# Patient Record
Sex: Male | Born: 1985 | Race: Black or African American | Hispanic: No | Marital: Single | Smoking: Current every day smoker
Health system: Southern US, Community
[De-identification: ages and names within clinical notes are randomized; demographics above are authoritative.]

## PROBLEM LIST (undated history)

## (undated) DIAGNOSIS — J020 Streptococcal pharyngitis: Secondary | ICD-10-CM

---

## 2005-12-27 ENCOUNTER — Emergency Department (HOSPITAL_COMMUNITY): Admission: EM | Admit: 2005-12-27 | Discharge: 2005-12-27 | Payer: Self-pay | Admitting: Emergency Medicine

## 2010-12-19 ENCOUNTER — Emergency Department (HOSPITAL_COMMUNITY)
Admission: EM | Admit: 2010-12-19 | Discharge: 2010-12-19 | Payer: Self-pay | Source: Home / Self Care | Admitting: Emergency Medicine

## 2010-12-23 ENCOUNTER — Emergency Department (HOSPITAL_COMMUNITY)
Admission: EM | Admit: 2010-12-23 | Discharge: 2010-12-23 | Payer: Self-pay | Source: Home / Self Care | Admitting: Emergency Medicine

## 2011-03-05 LAB — URINE MICROSCOPIC-ADD ON

## 2011-03-05 LAB — DIFFERENTIAL
Lymphocytes Relative: 19 % (ref 12–46)
Lymphs Abs: 1.1 10*3/uL (ref 0.7–4.0)
Monocytes Relative: 10 % (ref 3–12)
Neutro Abs: 3.9 10*3/uL (ref 1.7–7.7)
Neutrophils Relative %: 68 % (ref 43–77)

## 2011-03-05 LAB — CBC
Hemoglobin: 13.8 g/dL (ref 13.0–17.0)
MCH: 30.5 pg (ref 26.0–34.0)
Platelets: 206 10*3/uL (ref 150–400)
RBC: 4.53 MIL/uL (ref 4.22–5.81)
WBC: 5.7 10*3/uL (ref 4.0–10.5)

## 2011-03-05 LAB — URINALYSIS, ROUTINE W REFLEX MICROSCOPIC
Bilirubin Urine: NEGATIVE
Glucose, UA: NEGATIVE mg/dL
Ketones, ur: NEGATIVE mg/dL
Nitrite: NEGATIVE
Protein, ur: NEGATIVE mg/dL
Urobilinogen, UA: 0.2 mg/dL (ref 0.0–1.0)
pH: 7 (ref 5.0–8.0)
pH: 7.5 (ref 5.0–8.0)

## 2011-03-05 LAB — BASIC METABOLIC PANEL
CO2: 31 mEq/L (ref 19–32)
Calcium: 9.2 mg/dL (ref 8.4–10.5)
Creatinine, Ser: 0.92 mg/dL (ref 0.4–1.5)
GFR calc Af Amer: >60 mL/min (ref >60)
GFR calc non Af Amer: >60 mL/min (ref >60)
Sodium: 137 mEq/L (ref 135–145)

## 2011-06-15 ENCOUNTER — Emergency Department (HOSPITAL_COMMUNITY): Payer: Self-pay

## 2011-06-15 ENCOUNTER — Emergency Department (HOSPITAL_COMMUNITY)
Admission: EM | Admit: 2011-06-15 | Discharge: 2011-06-15 | Disposition: A | Discharge status: Home / Self Care | Payer: Self-pay | Attending: Emergency Medicine | Admitting: Emergency Medicine

## 2011-06-15 DIAGNOSIS — R079 Chest pain, unspecified: Secondary | ICD-10-CM | POA: Insufficient documentation

## 2011-06-15 DIAGNOSIS — K2970 Gastritis, unspecified, without bleeding: Secondary | ICD-10-CM | POA: Insufficient documentation

## 2011-06-15 DIAGNOSIS — K2990 Gastroduodenitis, unspecified, without bleeding: Secondary | ICD-10-CM | POA: Insufficient documentation

## 2011-06-15 LAB — RAPID STREP SCREEN (MED CTR MEBANE ONLY): Streptococcus, Group A Screen (Direct): NEGATIVE

## 2012-03-03 ENCOUNTER — Emergency Department (HOSPITAL_COMMUNITY)
Admission: EM | Admit: 2012-03-03 | Discharge: 2012-03-03 | Disposition: A | Discharge status: Home / Self Care | Payer: Self-pay | Attending: Emergency Medicine | Admitting: Emergency Medicine

## 2012-03-03 ENCOUNTER — Encounter (HOSPITAL_COMMUNITY): Payer: Self-pay | Admitting: *Deleted

## 2012-03-03 DIAGNOSIS — R369 Urethral discharge, unspecified: Secondary | ICD-10-CM | POA: Insufficient documentation

## 2012-03-03 DIAGNOSIS — R300 Dysuria: Secondary | ICD-10-CM | POA: Insufficient documentation

## 2012-03-03 DIAGNOSIS — A64 Unspecified sexually transmitted disease: Secondary | ICD-10-CM | POA: Insufficient documentation

## 2012-03-03 LAB — URINE MICROSCOPIC-ADD ON

## 2012-03-03 LAB — URINALYSIS, ROUTINE W REFLEX MICROSCOPIC
Glucose, UA: NEGATIVE mg/dL
Nitrite: NEGATIVE
pH: 6 (ref 5.0–8.0)

## 2012-03-03 MED ORDER — AZITHROMYCIN 250 MG PO TABS
1000.0000 mg | ORAL_TABLET | Freq: Once | ORAL | Status: AC
  Administered 2012-03-03: 1000 mg via ORAL

## 2012-03-03 MED ORDER — CEFTRIAXONE SODIUM 250 MG IJ SOLR
250.0000 mg | Freq: Once | INTRAMUSCULAR | Status: AC
  Administered 2012-03-03: 250 mg via INTRAMUSCULAR

## 2012-03-03 MED ORDER — LIDOCAINE-EPINEPHRINE (PF) 1 %-1:200000 IJ SOLN
INTRAMUSCULAR | Status: AC
  Administered 2012-03-03: 21:00:00
  Filled 2012-03-03: qty 10

## 2012-03-03 MED ORDER — CEFTRIAXONE SODIUM 250 MG IJ SOLR
INTRAMUSCULAR | Status: AC
  Filled 2012-03-03: qty 250

## 2012-03-03 MED ORDER — AZITHROMYCIN 250 MG PO TABS
ORAL_TABLET | ORAL | Status: AC
  Administered 2012-03-03: 1000 mg via ORAL
  Filled 2012-03-03: qty 4

## 2012-03-03 NOTE — ED Notes (Signed)
Penile d/c and dysuria .

## 2012-03-03 NOTE — Discharge Instructions (Signed)
Safer Sex Your caregiver wants you to have this information about the infections that can be transmitted from sexual contact and how to prevent them. The idea behind safer sex is that you can be sexually active, and at the same time reduce the risk of giving or getting a sexually transmitted disease (STD). Every person should be aware of how to prevent him or herself and his or her sex partner from getting an STD. CAUSES OF STDS STDs are transmitted by sharing body fluids, which contain viruses and bacteria. The following fluids all transmit infections during sexual intercourse and sex acts:  Semen.   Saliva.   Urine.   Blood.   Vaginal mucus.  Examples of STDs include:  Chlamydia.   Gonorrhea.   Genital herpes.   Hepatitis B.   Human immunodeficiency virus or acquired immunodeficiency syndrome (HIV or AIDS).   Syphilis.   Trichomonas.   Pubic lice.   Human papillomavirus (HPV), which may include:   Genital warts.   Cervical dysplasia.   Cervical cancer (can develop with certain types of HPV).  SYMPTOMS  Sexual diseases often cause few or no symptoms until they are advanced, so a person can be infected and spread the infection without knowing it. Some STDs respond to treatment very well. Others, like HIV and herpes, cannot be cured, but are treated to reduce their effects. Specific symptoms include:  Abnormal vaginal discharge.   Irritation or itching in and around the vagina, and in the pubic hair.   Pain during sexual intercourse.   Bleeding during sexual intercourse.   Pelvic or abdominal pain.   Fever.   Growths in and around the vagina.   An ulcer in or around the vagina.   Swollen glands in the groin area.  DIAGNOSIS   Blood tests.   Pap test.   Culture test of abnormal vaginal discharge.   A test that applies a solution and examines the cervix with a lighted magnifying scope (colposcopy).   A test that examines the pelvis with a lighted  tube, through a small incision (laparoscopy).  TREATMENT  The treatment will depend on the cause of the STD.  Antibiotic treatment by injection, oral, creams, or suppositories in the vagina.   Over-the-counter medicated shampoo, to get rid of pubic lice.   Removing or treating growths with medicine, freezing, burning (electrocautery), or surgery.   Surgery treatment for HPV of the cervix.   Supportive medicines for herpes, HIV, AIDS, and hepatitis.  Being careful cannot eliminate all risk of infection, but sex can be made much safer. Safe sexual practices include body massage and gentle touching. Masturbation is safe, as long as body fluids do not contact skin that has sores or cuts. Dry kissing and oral sex on a man wearing a latex condom or on a woman wearing a male condom is also safe. Slightly less safe is intercourse while the man wears a latex condom or wet kissing. It is also safer to have one sex partner that you know is not having sex with anyone else. LENGTH OF ILLNESS An STD might be treated and cured in a week, sometimes a month, or more. And it can linger with symptoms for many years. STDs can also cause damage to the male organs. This can cause chronic pain, infertility, and recurrence of the STD, especially herpes, hepatitis, HIV, and HPV. HOME CARE INSTRUCTIONS AND PREVENTION  Alcohol and recreational drugs are often the reason given for not practicing safer sex. These substances affect  your judgment. Alcohol and recreational drugs can also impair your immune system, making you more vulnerable to disease.   Do not engage in risky and dangerous sexual practices, including:   Vaginal or anal sex without a condom.   Oral sex on a man without a condom.   Oral sex on a woman without a male condom.   Using saliva to lubricate a condom.   Any other sexual contact in which body fluids or blood from one partner contact the other partner.   You should use only latex  condoms for men and water soluble lubricants. Petroleum based lubricants or oils used to lubricate a condom will weaken the condom and increase the chance that it will break.   Think very carefully before having sex with anyone who is high risk for STDs and HIV. This includes IV drug users, people with multiple sexual partners, or people who have had an STD, or a positive hepatitis or HIV blood test.   Remember that even if your partner has had only one previous partner, their previous partner might have had multiple partners. If so, you are at high risk of being exposed to an STD. You and your sex partner should be the only sex partners with each other, with no one else involved.   A vaccine is available for hepatitis B and HPV through your caregiver or the Public Health Department. Everyone should be vaccinated with these vaccines.   Avoid risky sex practices. Sex acts that can break the skin make you more likely to get an STD.  SEEK MEDICAL CARE IF:   If you think you have an STD, even if you do not have any symptoms. Contact your caregiver for evaluation and treatment, if needed.   You think or know your sex partner has acquired an STD.   You have any of the symptoms mentioned above.  Document Released: 01/17/2005 Document Revised: 11/29/2011 Document Reviewed: 11/09/2009 [facility] Patient Information 2012 [place], [place].Sexually Transmitted Disease Sexually transmitted disease (STD) refers to any infection that is passed from person to person during sexual activity. This may happen by way of saliva, semen, blood, vaginal mucus, or urine. Common STDs include:  Gonorrhea.   Chlamydia.   Syphilis.   HIV/AIDS.   Genital herpes.   Hepatitis B and C.   Trichomonas.   Human papillomavirus (HPV).   Pubic lice.  CAUSES  An STD may be spread by bacteria, virus, or parasite. A person can get an STD by:  Sexual intercourse with an infected person.   Sharing sex toys with an  infected person.   Sharing needles with an infected person.   Having intimate contact with the genitals, mouth, or rectal areas of an infected person.  SYMPTOMS  Some people may not have any symptoms, but they can still pass the infection to others. Different STDs have different symptoms. Symptoms include:  Painful or bloody urination.   Pain in the pelvis, abdomen, vagina, anus, throat, or eyes.   Skin rash, itching, irritation, growths, or sores (lesions). These usually occur in the genital or anal area.   Abnormal vaginal discharge.   Penile discharge in men.   Soft, flesh-colored skin growths in the genital or anal area.   Fever.   Pain or bleeding during sexual intercourse.   Swollen glands in the groin area.   Yellow skin and eyes (jaundice). This is seen with hepatitis.  DIAGNOSIS  To make a diagnosis, your caregiver may:  Take a medical history.  Perform a physical exam.   Take a specimen (culture) to be examined.   Examine a sample of discharge under a microscope.   Perform blood tests.   Perform a Pap test, if this applies.   Perform a colposcopy.   Perform a laparoscopy.  TREATMENT   Chlamydia, gonorrhea, trichomonas, and syphilis can be cured with antibiotic medicine.   Genital herpes, hepatitis, and HIV can be treated, but not cured, with prescribed medicines. The medicines will lessen the symptoms.   Genital warts from HPV can be treated with medicine or by freezing, burning (electrocautery), or surgery. Warts may come back.   HPV is a virus and cannot be cured with medicine or surgery.However, abnormal areas may be followed very closely by your caregiver and may be removed from the cervix, vagina, or vulva through office procedures or surgery.  If your diagnosis is confirmed, your recent sexual partners need treatment. This is true even if they are symptom-free or have a negative culture or evaluation. They should not have sex until their  caregiver says it is okay. HOME CARE INSTRUCTIONS  All sexual partners should be informed, tested, and treated for all STDs.   Take your antibiotics as directed. Finish them even if you start to feel better.   Only take over-the-counter or prescription medicines for pain, discomfort, or fever as directed by your caregiver.   Rest.   Eat a balanced diet and drink enough fluids to keep your urine clear or pale yellow.   Do not have sex until treatment is completed and you have followed up with your caregiver. STDs should be checked after treatment.   Keep all follow-up appointments, Pap tests, and blood tests as directed by your caregiver.   Only use latex condoms and water-soluble lubricants during sexual activity. Do not use petroleum jelly or oils.   Avoid alcohol and illegal drugs.   Get vaccinated for HPV and hepatitis. If you have not received these vaccines in the past, talk to your caregiver about whether one or both might be right for you.   Avoid risky sex practices that can break the skin.  The only way to avoid getting an STD is to avoid all sexual activity.Latex condoms and dental dams (for oral sex) will help lessen the risk of getting an STD, but will not completely eliminate the risk. SEEK MEDICAL CARE IF:   You have a fever.   You have any new or worsening symptoms.  Document Released: 03/02/2003 Document Revised: 11/29/2011 Document Reviewed: 03/09/2011 [facility] Patient Information 2012 [place], [place].   You have been treated for gonorrhea and chlamydia.  We will notify you if the syphilis test is positive.  Practice safe sex.  Notify your sexual partner so that she can be evaluated and treated.

## 2012-03-03 NOTE — ED Provider Notes (Signed)
History     CSN: [id number]  Arrival date & time 03/03/12  1724   First MD Initiated Contact with Patient 03/03/12 1953      Chief Complaint  Patient presents with  . Dysuria    (Consider location/radiation/quality/duration/timing/severity/associated sxs/prior treatment) HPI Comments: Dysuria and "yellowish" penile d/c x 2 days.  Unprotected sex 6-7 days ago.  Patient is a 26 y.o. male presenting with dysuria. The history is provided by the patient. No language interpreter was used.  Dysuria  This is a new problem. The current episode started 2 days ago. The problem occurs every urination. The problem has not changed since onset.The quality of the pain is described as burning. The pain is moderate. There has been no fever. He is sexually active. There is no history of pyelonephritis. Associated symptoms include discharge. Pertinent negatives include no frequency, no hematuria, no hesitancy and no urgency. He has tried nothing for the symptoms. His past medical history does not include kidney stones, single kidney, urological procedure, recurrent UTIs, urinary stasis or catheterization.    History reviewed. No pertinent past medical history.  History reviewed. No pertinent past surgical history.  History reviewed. No pertinent family history.  History  Substance Use Topics  . Smoking status: Current Everyday Smoker  . Smokeless tobacco: Not on file  . Alcohol Use: Yes      Review of Systems  Genitourinary: Positive for dysuria and discharge. Negative for hesitancy, urgency, frequency, hematuria, decreased urine volume, penile swelling, scrotal swelling, penile pain and testicular pain.  All other systems reviewed and are negative.    Allergies  Review of patient's allergies indicates no known allergies.  Home Medications  No current outpatient prescriptions on file.  BP 118/64  Pulse 92  Temp(Src) 98.3 F (36.8 C) (Oral)  Resp 20  Ht 5\' 10"  (1.778 m)  Wt 156 lb  (70.761 kg)  BMI 22.38 kg/m2  SpO2 99%  Physical Exam  Nursing note and vitals reviewed. Constitutional: He is oriented to person, place, and time. He appears well-developed and well-nourished.  HENT:  Head: Normocephalic and atraumatic.  Eyes: EOM are normal.  Neck: Normal range of motion.  Cardiovascular: Normal rate, regular rhythm, normal heart sounds and intact distal pulses.   Pulmonary/Chest: Effort normal and breath sounds normal. No respiratory distress.  Abdominal: Soft. He exhibits no distension. There is no tenderness.  Genitourinary: Testes normal. Circumcised. No penile tenderness. Discharge found.  Musculoskeletal: Normal range of motion.  Lymphadenopathy:       Right: No inguinal adenopathy present.       Left: No inguinal adenopathy present.  Neurological: He is alert and oriented to person, place, and time.  Skin: Skin is warm and dry.  Psychiatric: He has a normal mood and affect. Judgment normal.    ED Course  Procedures (including critical care time)  Labs Reviewed  URINALYSIS, ROUTINE W REFLEX MICROSCOPIC - Abnormal; Notable for the following:    APPearance HAZY (*)    Leukocytes, UA SMALL (*)    All other components within normal limits  URINE MICROSCOPIC-ADD ON  GC/CHLAMYDIA PROBE AMP, GENITAL  RPR   No results found.   1. STD (sexually transmitted disease)       MDM  Rocephin and zithromax        Worthy Rancher, PA 03/03/12 2048  Worthy Rancher, PA 03/04/12 1252

## 2012-03-05 LAB — GC/CHLAMYDIA PROBE AMP, GENITAL: GC Probe Amp, Genital: POSITIVE — AB

## 2012-03-05 NOTE — ED Provider Notes (Signed)
Medical screening examination/treatment/procedure(s) were performed by non-physician practitioner and as supervising physician I was immediately available for consultation/collaboration.    Jenel Gierke L Emilyrose Darrah, MD 03/05/12 2204 

## 2012-03-06 NOTE — ED Notes (Signed)
+   GC Patient treated with Zithromax and rocephin-chart appended per protocol MD.[facility] letter faxed

## 2012-03-08 NOTE — ED Notes (Signed)
Attempted to call Patient-No answer 

## 2012-03-09 NOTE — ED Notes (Signed)
Pt notified of results

## 2012-06-21 IMAGING — CT CT ABD-PELV W/O CM
3 of 4 series · 9 of 46 positions shown, 16 images · non-contrast
Comparison: None

CLINICAL DATA: Left-sided flank pain since last night.  Nausea
vomiting.

CT ABDOMEN AND PELVIS WITHOUT CONTRAST (CT UROGRAM)
TECHNIQUE: Contiguous axial images of the abdomen and pelvis
without oral or intravenous contrast were obtained.

[Series 3: lung 5.0 b60f · axial · 0.62mm/px · z∈[-113,-38]mm · 5 of 23 slices shown, 10 images]
[im 4/23  soft-tissue]
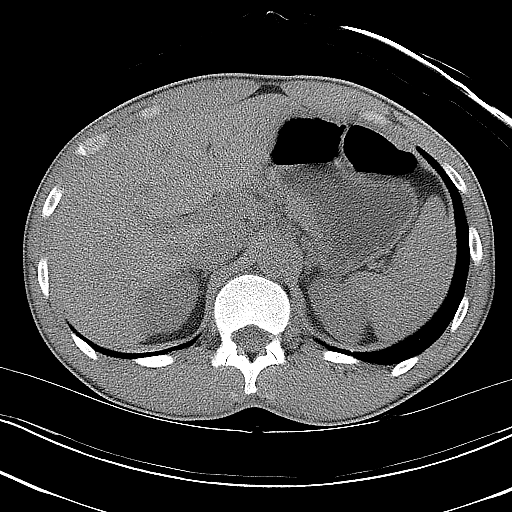
[im 4/23  bone]
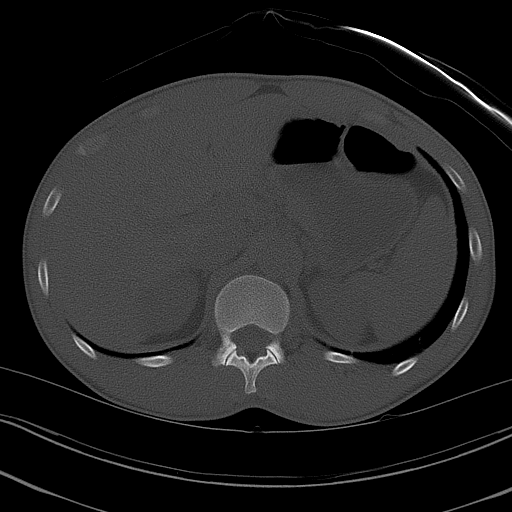
[im 8/23  soft-tissue]
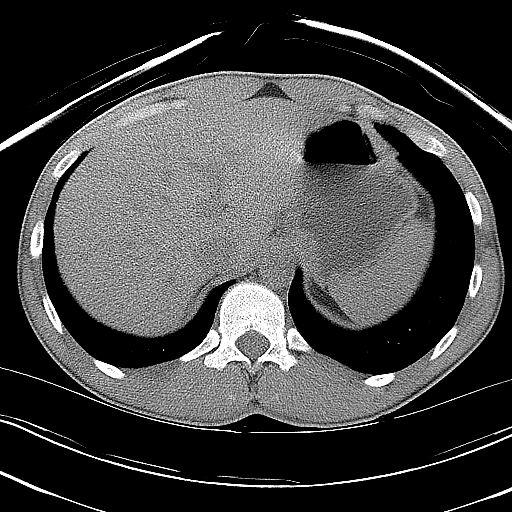
[im 8/23  lung]
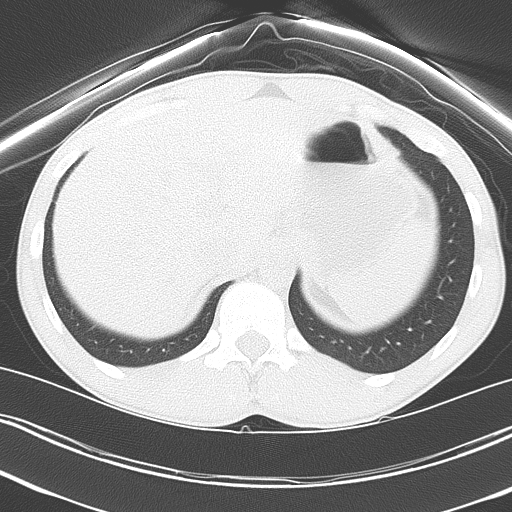
[im 12/23  soft-tissue]
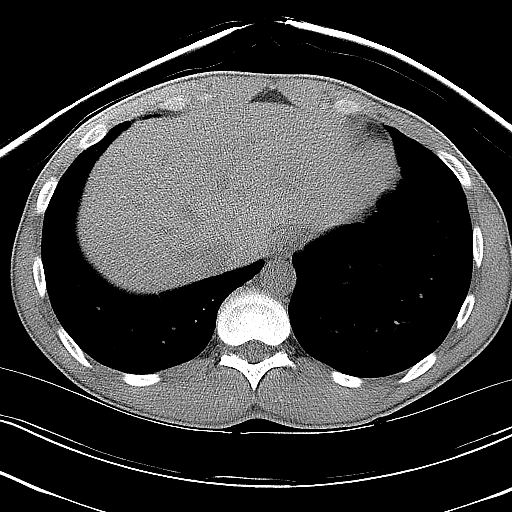
[im 12/23  lung]
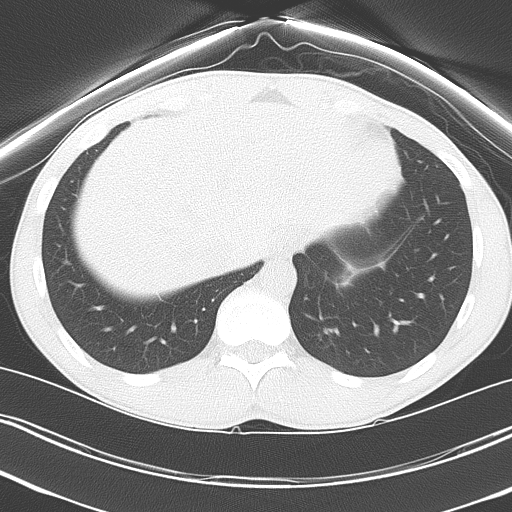
[im 15/23  soft-tissue]
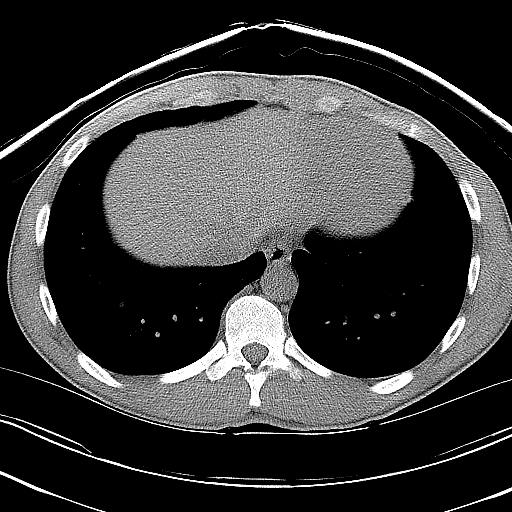
[im 15/23  lung]
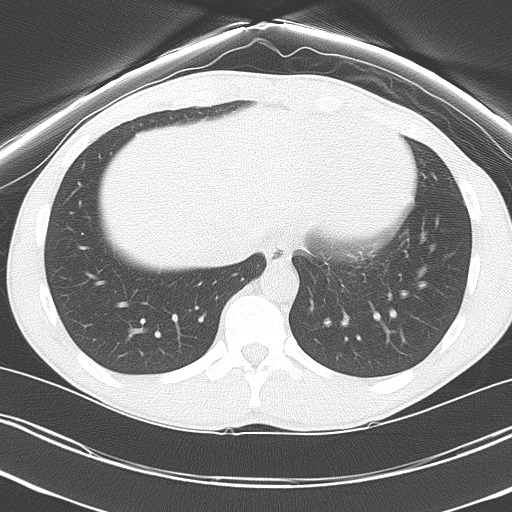
[im 19/23  soft-tissue]
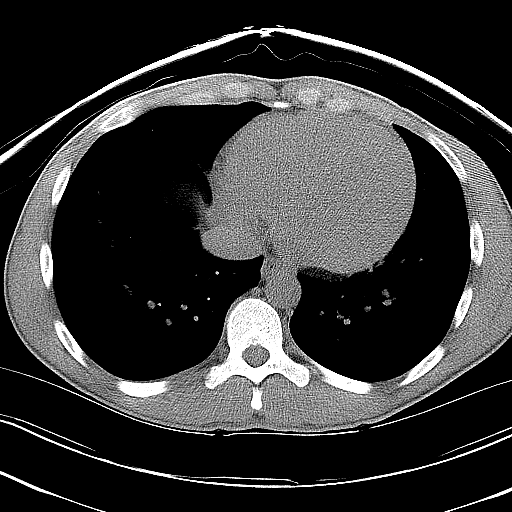
[im 19/23  lung]
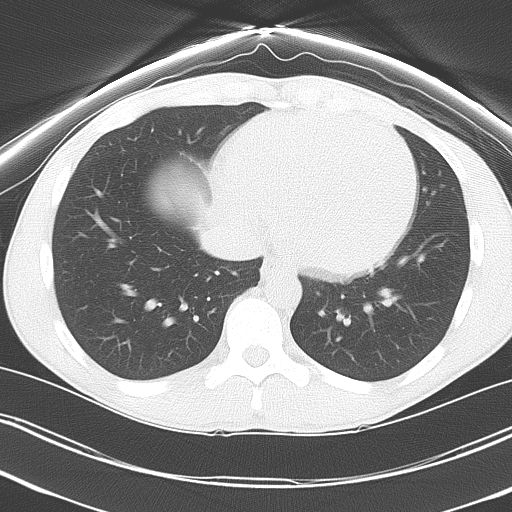

[Series 4: mpr coronal (id) · coronal · 0.64mm/px · 3 of 69 slices shown, 4 images]
[im 23/69  soft-tissue]
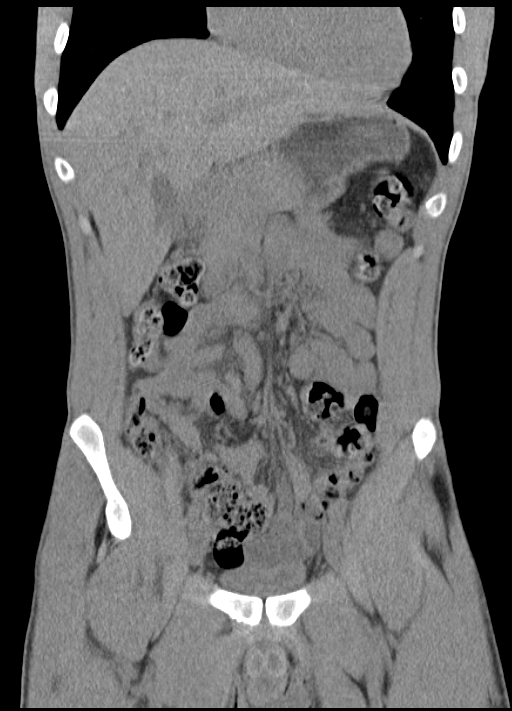
[im 31/69  soft-tissue]
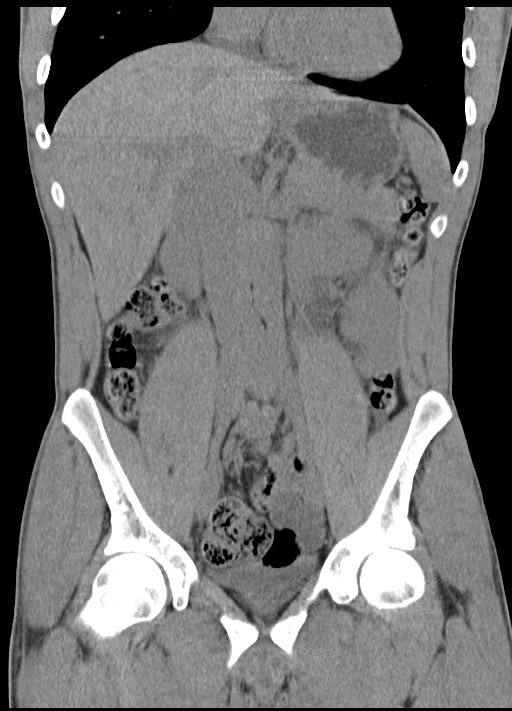
[im 31/69  bone]
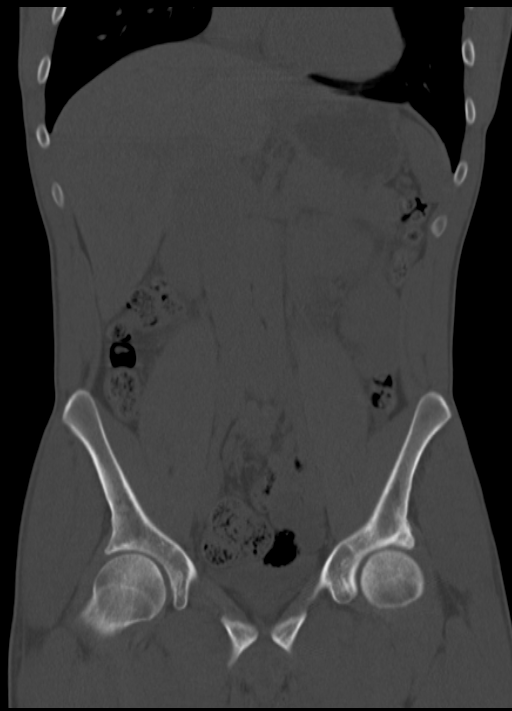
[im 38/69  soft-tissue]
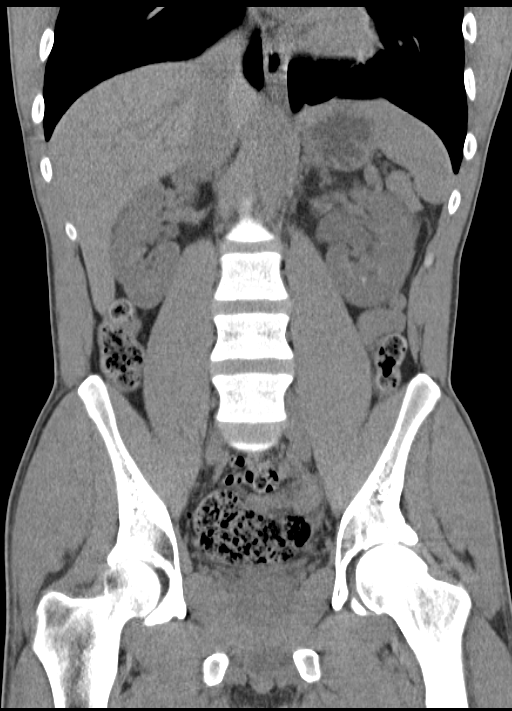

[Series 5: mpr sagittal (id) · sagittal · 0.47mm/px · 1 of 107 slices shown, 2 images]
[im 36/107  soft-tissue]
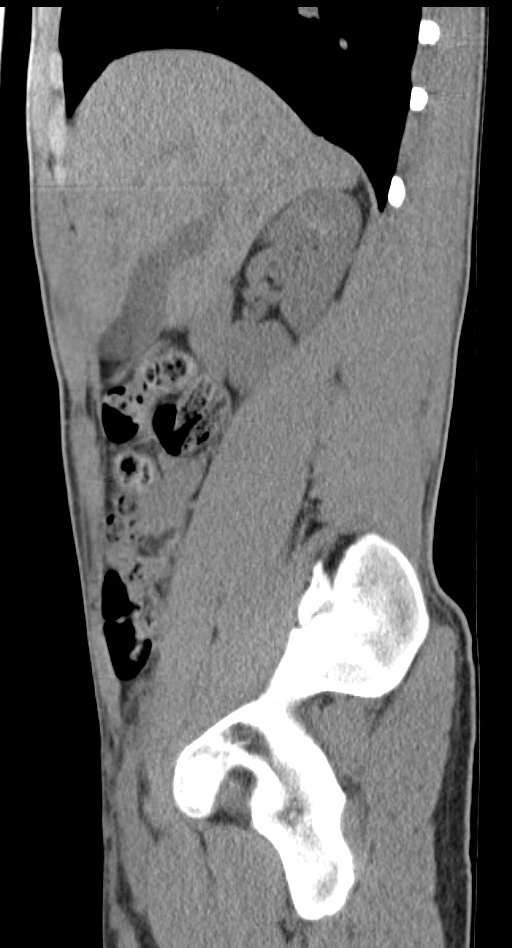
[im 36/107  bone]
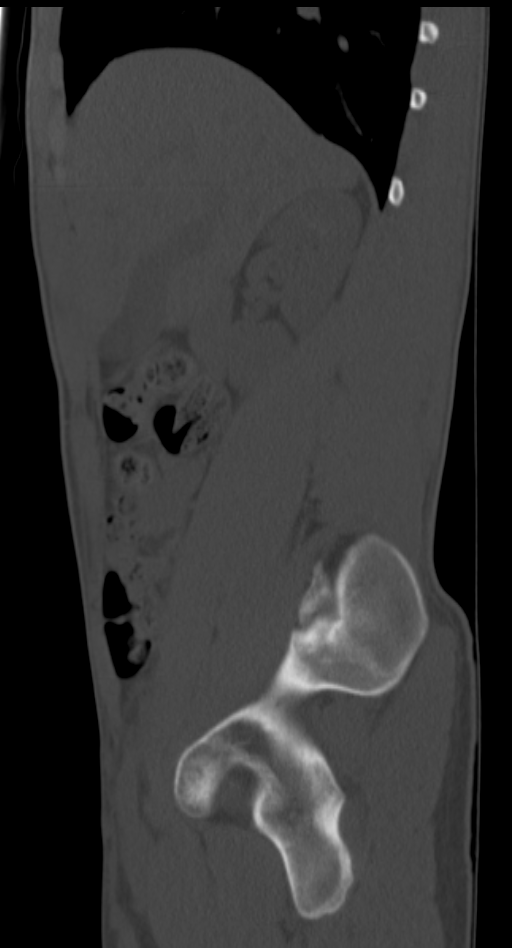

[9 of 46 positions shown; findings below may reference images not displayed]

FINDINGS: Exam is limited for evaluation of entities other than
urinary tract calculi due to lack of oral or intravenous contrast.

 Clear lung bases.  Normal heart size without pericardial or
pleural effusion.  Normal uninfused appearance of the liver,
spleen, stomach, pancreas, gallbladder, biliary tract, adrenal
glands.

Punctate interpolar right renal collecting system calculus which is
nonobstructive.  Punctate interpolar left renal calculus.

Moderate left-sided urinary tract obstruction secondary to a
proximal ureteric calculus which measures 4 mm on transverse image
39 and 5 mm on coronal image 32.

Numerous other foci of increased density within the medullary
pyramids.

No retroperitoneal or retrocrural adenopathy.

Normal colon and terminal ileum.  Normal small bowel without
abdominal ascites.

 No pelvic adenopathy.    No distal urinary tract calculi or
hydroureter.  Normal urinary bladder and prostate.  Suspect trace
cul-de-sac fluid on image 70.

A mixed sclerotic and lucent lesion within the left iliac wing
measures 2.0 cm on image 54.
IMPRESSION: 1.  Moderate urinary tract obstruction secondary to a proximal left
ureteric stone.
2.  Bilateral renal collecting system calculi.  Other foci of
increased density within the medullary pyramids are favored to
represent concentrated urine.  Three left iliac
3.  Left iliac wing lesion which is indeterminate.  Relatively
indolent appearing.  Question fibrous dysplasia.  Recommend  plain
films to confirm visualization and allow ongoing  surveillance.
4.  Trace cul-de-sac fluid is indeterminate but could be secondary
to the urinary tract obstruction.

## 2012-07-09 ENCOUNTER — Emergency Department (HOSPITAL_COMMUNITY)
Admission: EM | Admit: 2012-07-09 | Discharge: 2012-07-09 | Disposition: A | Discharge status: Home / Self Care | Payer: Self-pay | Attending: Emergency Medicine | Admitting: Emergency Medicine

## 2012-07-09 ENCOUNTER — Encounter (HOSPITAL_COMMUNITY): Payer: Self-pay | Admitting: Emergency Medicine

## 2012-07-09 DIAGNOSIS — J029 Acute pharyngitis, unspecified: Secondary | ICD-10-CM | POA: Insufficient documentation

## 2012-07-09 DIAGNOSIS — IMO0001 Reserved for inherently not codable concepts without codable children: Secondary | ICD-10-CM | POA: Insufficient documentation

## 2012-07-09 DIAGNOSIS — R509 Fever, unspecified: Secondary | ICD-10-CM | POA: Insufficient documentation

## 2012-07-09 DIAGNOSIS — F17200 Nicotine dependence, unspecified, uncomplicated: Secondary | ICD-10-CM | POA: Insufficient documentation

## 2012-07-09 DIAGNOSIS — R51 Headache: Secondary | ICD-10-CM | POA: Insufficient documentation

## 2012-07-09 HISTORY — DX: Streptococcal pharyngitis: J02.0

## 2012-07-09 LAB — RAPID STREP SCREEN (MED CTR MEBANE ONLY): Streptococcus, Group A Screen (Direct): NEGATIVE

## 2012-07-09 MED ORDER — PENICILLIN G BENZATHINE 1200000 UNIT/2ML IM SUSP
1.2000 10*6.[IU] | Freq: Once | INTRAMUSCULAR | Status: AC
  Administered 2012-07-09: 1.2 10*6.[IU] via INTRAMUSCULAR
  Filled 2012-07-09: qty 2

## 2012-07-09 MED ORDER — ONDANSETRON 4 MG PO TBDP
4.0000 mg | ORAL_TABLET | Freq: Once | ORAL | Status: AC
  Administered 2012-07-09: 4 mg via ORAL
  Filled 2012-07-09: qty 1

## 2012-07-09 MED ORDER — HYDROCODONE-ACETAMINOPHEN 7.5-500 MG/15ML PO SOLN: 15.0000 mL | ORAL | Status: AC | PRN

## 2012-07-09 MED ORDER — IBUPROFEN 100 MG/5ML PO SUSP
400.0000 mg | Freq: Once | ORAL | Status: AC
  Administered 2012-07-09: 400 mg via ORAL
  Filled 2012-07-09: qty 20

## 2012-07-09 NOTE — ED Provider Notes (Signed)
Medical screening examination/treatment/procedure(s) were performed by non-physician practitioner and as supervising physician I was immediately available for consultation/collaboration.  Donnetta Hutching, MD 07/09/12 [phone]

## 2012-07-09 NOTE — ED Provider Notes (Signed)
History     CSN: [id number]  Arrival date & time 07/09/12  1433   First MD Initiated Contact with Patient 07/09/12 1558      Chief Complaint  Patient presents with  . Sore Throat    (Consider location/radiation/quality/duration/timing/severity/associated sxs/prior treatment) Patient is a 26 y.o. male presenting with pharyngitis. The history is provided by the patient.  Sore Throat This is a new problem. The current episode started in the past 7 days. The problem occurs constantly. The problem has been gradually worsening. Associated symptoms include chills, fatigue, a fever, headaches and myalgias. Pertinent negatives include no abdominal pain, arthralgias, chest pain, coughing or neck pain. The symptoms are aggravated by swallowing. He has tried acetaminophen (salt water gargles.) for the symptoms.    Past Medical History  Diagnosis Date  . Strep throat     History reviewed. No pertinent past surgical history.  History reviewed. No pertinent family history.  History  Substance Use Topics  . Smoking status: Current Everyday Smoker  . Smokeless tobacco: Not on file  . Alcohol Use: Yes      Review of Systems  Constitutional: Positive for fever, chills and fatigue. Negative for activity change.       All ROS Neg except as noted in HPI  HENT: Negative for nosebleeds and neck pain.   Eyes: Negative for photophobia and discharge.  Respiratory: Negative for cough, shortness of breath and wheezing.   Cardiovascular: Negative for chest pain and palpitations.  Gastrointestinal: Negative for abdominal pain and blood in stool.  Genitourinary: Negative for dysuria, frequency and hematuria.  Musculoskeletal: Positive for myalgias. Negative for back pain and arthralgias.  Skin: Negative.   Neurological: Positive for headaches. Negative for dizziness, seizures and speech difficulty.  Psychiatric/Behavioral: Negative for hallucinations and confusion.    Allergies  Review of  patient's allergies indicates no known allergies.  Home Medications   Current Outpatient Rx  Name Route Sig Dispense Refill  . VAPORIZING CHEST RUB EX Apply externally Apply 1 application topically as needed. For relief    . PHENOL 1.4 % MT LIQD Mouth/Throat Use as directed 1 spray in the mouth or throat 5 (five) times daily as needed.      BP 135/66  Pulse 106  Temp 101.1 F (38.4 C) (Oral)  Resp 20  Ht 5\' 10"  (1.778 m)  Wt 155 lb (70.308 kg)  BMI 22.24 kg/m2  SpO2 100%  Physical Exam  Nursing note and vitals reviewed. Constitutional: He is oriented to person, place, and time. He appears well-developed and well-nourished.  Non-toxic appearance.  HENT:  Head: Normocephalic.  Right Ear: Tympanic membrane and external ear normal.  Left Ear: Tympanic membrane and external ear normal.       There is increased redness of the posterior pharynx. The uvula is mild to moderately swollen. The uvula remains in the midline. There is increased redness and exudate of the tonsils. There is no evidence for abscess at this time.  Eyes: EOM and lids are normal. Pupils are equal, round, and reactive to light.  Neck: Normal range of motion. Neck supple. Carotid bruit is not present.  Cardiovascular: Regular rhythm, normal heart sounds, intact distal pulses and normal pulses.  Tachycardia present.   Pulmonary/Chest: Breath sounds normal. No respiratory distress.  Abdominal: Soft. Bowel sounds are normal. There is no tenderness. There is no guarding.  Musculoskeletal: Normal range of motion.  Lymphadenopathy:       Head (right side): No submandibular adenopathy present.  Head (left side): No submandibular adenopathy present.    He has cervical adenopathy.  Neurological: He is alert and oriented to person, place, and time. He has normal strength. No cranial nerve deficit or sensory deficit.  Skin: Skin is warm and dry.  Psychiatric: He has a normal mood and affect. His speech is normal.     ED Course  Procedures (including critical care time)   Labs Reviewed  RAPID STREP SCREEN   No results found.   No diagnosis found.    MDM  I have reviewed nursing notes, vital signs, and all appropriate lab and imaging results for this patient. Patient presents with 3 days of fever, chills, body aches, and headaches. The patient has severe sore throat with difficulty swallowing anything solid. Patient was treated with Bicillin 1.2 million units. Prescription for Norco 7.5 mg liquid every 4 hours as needed for pain has been given to the patient. Patient advised to wash hands frequently, and to see his primary physician if not improving.       Kathie Dike, [place] 07/09/12 [phone]

## 2012-07-09 NOTE — ED Notes (Signed)
History of strep

## 2014-06-13 ENCOUNTER — Encounter (HOSPITAL_COMMUNITY): Payer: Self-pay | Admitting: Emergency Medicine

## 2014-06-13 ENCOUNTER — Emergency Department (HOSPITAL_COMMUNITY)
Admission: EM | Admit: 2014-06-13 | Discharge: 2014-06-13 | Disposition: A | Discharge status: Home / Self Care | Payer: Self-pay | Attending: Emergency Medicine | Admitting: Emergency Medicine

## 2014-06-13 DIAGNOSIS — Z79899 Other long term (current) drug therapy: Secondary | ICD-10-CM | POA: Insufficient documentation

## 2014-06-13 DIAGNOSIS — F17200 Nicotine dependence, unspecified, uncomplicated: Secondary | ICD-10-CM | POA: Insufficient documentation

## 2014-06-13 DIAGNOSIS — Z113 Encounter for screening for infections with a predominantly sexual mode of transmission: Secondary | ICD-10-CM | POA: Insufficient documentation

## 2014-06-13 DIAGNOSIS — J3489 Other specified disorders of nose and nasal sinuses: Secondary | ICD-10-CM | POA: Insufficient documentation

## 2014-06-13 DIAGNOSIS — Z202 Contact with and (suspected) exposure to infections with a predominantly sexual mode of transmission: Secondary | ICD-10-CM

## 2014-06-13 DIAGNOSIS — Z8619 Personal history of other infectious and parasitic diseases: Secondary | ICD-10-CM | POA: Insufficient documentation

## 2014-06-13 DIAGNOSIS — R369 Urethral discharge, unspecified: Secondary | ICD-10-CM | POA: Insufficient documentation

## 2014-06-13 LAB — URINALYSIS, ROUTINE W REFLEX MICROSCOPIC
Bilirubin Urine: NEGATIVE
Glucose, UA: NEGATIVE mg/dL
HGB URINE DIPSTICK: NEGATIVE
Ketones, ur: NEGATIVE mg/dL
Leukocytes, UA: NEGATIVE
Nitrite: NEGATIVE
PROTEIN: NEGATIVE mg/dL
Specific Gravity, Urine: 1.025 (ref 1.005–1.030)
UROBILINOGEN UA: 0.2 mg/dL (ref 0.0–1.0)
pH: 6 (ref 5.0–8.0)

## 2014-06-13 MED ORDER — CEFTRIAXONE SODIUM 250 MG IJ SOLR
250.0000 mg | Freq: Once | INTRAMUSCULAR | Status: AC
  Administered 2014-06-13: 250 mg via INTRAMUSCULAR
  Filled 2014-06-13: qty 250

## 2014-06-13 MED ORDER — LIDOCAINE HCL (PF) 1 % IJ SOLN
INTRAMUSCULAR | Status: AC
  Filled 2014-06-13: qty 5

## 2014-06-13 MED ORDER — AZITHROMYCIN 250 MG PO TABS
1000.0000 mg | ORAL_TABLET | Freq: Once | ORAL | Status: AC
  Administered 2014-06-13: 1000 mg via ORAL
  Filled 2014-06-13: qty 4

## 2014-06-13 NOTE — Discharge Instructions (Signed)
Follow up with the Health Department for additional screening such as HIV, Hepatitis and Syphilis

## 2014-06-13 NOTE — ED Notes (Signed)
Pt denies any discomfort in urination

## 2014-06-13 NOTE — ED Provider Notes (Signed)
CSN: [id number]     Arrival date & time 06/13/14  1249 History   This chart was scribed for nurse practitioner working with  Donnetta HutchingBrian Cook, MD by Andrew Auaven Small, ED Scribe. This patient was seen in room APFT24/APFT24 and the patient's care was started at 1:07 PM.  Chief Complaint  Patient presents with  . SEXUALLY TRANSMITTED DISEASE   The history is provided by the patient. No language interpreter was used.   HPI Comments:  Verdis FredericksonReginald R Tensley is a 28 y.o. Male with h/o chlamydia who presents to the Emergency Department complaining of possible STD x 2 days with associated penile discharge and swelling. Pt reports he had unprotected intercourse 4 days ago with his sons mother. He states he has not had intercourse with her in couple of months. She has had abnormal vaginal bleeding and not sure if she has an infection or if it was due to her new birth control.  He began feeling penile pain described as constant throbbing 1 day ago. Pt also noticed whitish discharge last night and again this morning. Pt has seasonal allergies which cause respiratory difficulties at times. Pt denies fever, chills, otalgia, sore throat, nausea, emesis, and abdominal pain.  Pt denies dysuria and difficulty urinating. Pt denies any other medical problems.   Past Medical History  Diagnosis Date  . Strep throat    No past surgical history on file. No family history on file. History  Substance Use Topics  . Smoking status: Current Every Day Smoker  . Smokeless tobacco: Not on file  . Alcohol Use: Yes    Review of Systems  Constitutional: Negative for fever and chills.  HENT: Positive for congestion. Negative for ear pain and sore throat.   Eyes: Negative for redness and itching.  Respiratory: Negative for cough.   Gastrointestinal: Negative for nausea, vomiting, abdominal pain and constipation.  Genitourinary: Positive for discharge and penile swelling. Negative for dysuria, urgency, frequency, hematuria, decreased  urine volume, difficulty urinating and genital sores.  Musculoskeletal: Negative for back pain.  Skin: Negative for rash.  Allergic/Immunologic: Negative for immunocompromised state.  Neurological: Negative for dizziness and headaches.  Psychiatric/Behavioral: Negative for confusion. The patient is not nervous/anxious.     Allergies  Review of patient's allergies indicates no known allergies.  Home Medications   Prior to Admission medications   Medication Sig Start Date End Date Taking? Authorizing Provider  Camphor-Eucalyptus-Menthol (VAPORIZING CHEST RUB EX) Apply 1 application topically as needed. For relief    Historical Provider, MD  phenol (CHLORASEPTIC) 1.4 % LIQD Use as directed 1 spray in the mouth or throat 5 (five) times daily as needed.    Historical Provider, MD   Triage Vitals- BP 113/71  Pulse 80  Temp(Src) 98.6 F (37 C) (Oral)  Resp 16  Ht 5\' 10"  (1.778 m)  Wt 155 lb (70.308 kg)  BMI 22.24 kg/m2  SpO2 97% Physical Exam  Nursing note and vitals reviewed. Constitutional: He is oriented to person, place, and time. He appears well-developed and well-nourished. No distress.  HENT:  Head: Normocephalic and atraumatic.  Eyes: Conjunctivae and EOM are normal.  Neck: Neck supple.  Cardiovascular: Normal rate.   Pulmonary/Chest: Effort normal. No respiratory distress.  Abdominal: Soft. There is no tenderness. Hernia confirmed negative in the right inguinal area and confirmed negative in the left inguinal area.  Genitourinary: Discharge found.  Pt is circumcised   No scrotal tenderness or edema Urethra has erythema with a small amount of discharge  Musculoskeletal:  Normal range of motion.  Lymphadenopathy:       Right: No inguinal adenopathy present.       Left: No inguinal adenopathy present.  Neurological: He is alert and oriented to person, place, and time.  Skin: Skin is warm and dry.  Psychiatric: He has a normal mood and affect. His behavior is normal.     ED Course  Procedures (including critical care time) DIAGNOSTIC STUDIES: Oxygen Saturation is 97% on RA, normal by my interpretation.   COORDINATION OF CARE: 1:17 PM-Discussed treatment plan which includes treatment for chlamydia with pt at bedside and pt agreed to plan.    Medications - No data to display  Labs Review Labs Reviewed - No data to display Results for orders placed during the hospital encounter of 06/13/14 (from the past 24 hour(s))  URINALYSIS, ROUTINE W REFLEX MICROSCOPIC     Status: None   Collection Time    06/13/14  1:06 PM      Result Value Ref Range   Color, Urine YELLOW  YELLOW   APPearance CLEAR  CLEAR   Specific Gravity, Urine 1.025  1.005 - 1.030   pH 6.0  5.0 - 8.0   Glucose, UA NEGATIVE  NEGATIVE mg/dL   Hgb urine dipstick NEGATIVE  NEGATIVE   Bilirubin Urine NEGATIVE  NEGATIVE   Ketones, ur NEGATIVE  NEGATIVE mg/dL   Protein, ur NEGATIVE  NEGATIVE mg/dL   Urobilinogen, UA 0.2  0.0 - 1.0 mg/dL   Nitrite NEGATIVE  NEGATIVE   Leukocytes, UA NEGATIVE  NEGATIVE     MDM  28 y.o. male with urethral discharge and pain. ? STD exposure. Will treat for GC and Chlamydia. Patient will follow up with the health department for additional testing. Discussed with the patient and all questioned fully answered. He will return if any problems arise.     Medication List    ASK your doctor about these medications       phenol 1.4 % Liqd  Commonly known as:  CHLORASEPTIC  Use as directed 1 spray in the mouth or throat 5 (five) times daily as needed.     VAPORIZING CHEST RUB EX  Apply 1 application topically as needed. For relief        I personally performed the services described in this documentation, which was scribed in my presence. The recorded information has been reviewed and is accurate.    [place] Point[place] M [place], [place] 06/13/14 [phone]

## 2014-06-13 NOTE — ED Notes (Signed)
Pt had sex with male partner on Thursday/Friday, Saturday pt states pain (throbbing) to penis and today noted to have white almost yellow in color discharge per pt

## 2014-06-13 NOTE — ED Notes (Signed)
Pt reports penile discharge and swelling x2 days ago. Pt denies any difficulty urinating.

## 2014-06-14 LAB — GC/CHLAMYDIA PROBE AMP
CT Probe RNA: NEGATIVE
GC PROBE AMP APTIMA: POSITIVE — AB

## 2014-06-15 ENCOUNTER — Telehealth (HOSPITAL_BASED_OUTPATIENT_CLINIC_OR_DEPARTMENT_OTHER): Payer: Self-pay | Admitting: Emergency Medicine

## 2014-06-15 NOTE — Telephone Encounter (Signed)
Positive Gonorrhea culture  Treated with:  Rocephin 250 mg IM and Azithromycin 1,000 mg PO while in ED. Sensitive to same per protocol MD  [facility]  First attempt to contact patient unsuccessful - no answer

## 2014-06-16 NOTE — ED Provider Notes (Signed)
Medical screening examination/treatment/procedure(s) were performed by non-physician practitioner and as supervising physician I was immediately available for consultation/collaboration.   EKG Interpretation None       Donnetta HutchingBrian Ieisha Gao, MD 06/16/14 [phone]

## 2014-06-21 ENCOUNTER — Telehealth (HOSPITAL_BASED_OUTPATIENT_CLINIC_OR_DEPARTMENT_OTHER): Payer: Self-pay

## 2014-06-21 ENCOUNTER — Encounter (HOSPITAL_COMMUNITY): Payer: Self-pay | Admitting: Emergency Medicine

## 2014-06-21 ENCOUNTER — Emergency Department (HOSPITAL_COMMUNITY)
Admission: EM | Admit: 2014-06-21 | Discharge: 2014-06-21 | Disposition: A | Discharge status: Home / Self Care | Payer: Self-pay | Attending: Emergency Medicine | Admitting: Emergency Medicine

## 2014-06-21 DIAGNOSIS — A64 Unspecified sexually transmitted disease: Secondary | ICD-10-CM | POA: Insufficient documentation

## 2014-06-21 DIAGNOSIS — Z8619 Personal history of other infectious and parasitic diseases: Secondary | ICD-10-CM | POA: Insufficient documentation

## 2014-06-21 DIAGNOSIS — F17200 Nicotine dependence, unspecified, uncomplicated: Secondary | ICD-10-CM | POA: Insufficient documentation

## 2014-06-21 NOTE — ED Notes (Signed)
Pt says he was pos for GC and treated here. Says his girlfriend pos for chlamydia.   Wants to be checked.

## 2014-06-21 NOTE — Discharge Instructions (Signed)
Sexually Transmitted Disease A sexually transmitted disease (STD) is a disease or infection often passed to another person during sex. However, STDs can be passed through nonsexual ways. An STD can be passed through:  Spit (saliva).  Semen.  Blood.  Mucus from the vagina.  Pee (urine). HOW CAN I LESSEN MY CHANCES OF GETTING AN STD?  Use:  Latex condoms.  Water-soluble lubricants with condoms. Do not use petroleum jelly or oils.  Dental dams. These are small pieces of latex that are used as a barrier during oral sex.  Avoid having more than one sex partner.  Do not have sex with someone who has other sex partners.  Do not have sex with anyone you do not know or who is at high risk for an STD.  Avoid risky sex that can break your skin.  Do not have sex if you have open sores on your mouth or skin.  Avoid drinking too much alcohol or taking illegal drugs. Alcohol and drugs can affect your good judgment.  Avoid oral and anal sex acts.  Get shots (vaccines) for HPV and hepatitis.  If you are at risk of being infected with HIV, it is advised that you take a certain medicine daily to prevent HIV infection. This is called pre-exposure prophylaxis (PrEP). You may be at risk if:  You are a man who has sex with other men (MSM).  You are attracted to the opposite sex (heterosexual) and are having sex with more than one partner.  You take drugs with a needle.  You have sex with someone who has HIV.  Talk with your doctor about if you are at high risk of being infected with HIV. If you begin to take PrEP, get tested for HIV first. Get tested every 3 months for as long as you are taking PrEP. WHAT SHOULD I DO IF I THINK I HAVE AN STD?  See your doctor.  Tell your sex partner(s) that you have an STD. They should be tested and treated.  Do not have sex until your doctor says it is okay. WHEN SHOULD I GET HELP? Get help right away if:  You have bad belly (abdominal)  pain.  You are a man and have puffiness (swelling) or pain in your testicles.  You are a woman and have puffiness in your vagina. Document Released: 01/17/2005 Document Revised: 12/15/2013 Document Reviewed: 06/05/2013 [employer] Patient Information 2015 [employer]. This information is not intended to replace advice given to you by your health care provider. Make sure you discuss any questions you have with your health care provider.  

## 2014-06-21 NOTE — ED Provider Notes (Signed)
CSN: [id number]     Arrival date & time 06/21/14  1835 History   First MD Initiated Contact with Patient 06/21/14 1904 This chart was scribed for non-physician practitioner Janne NapoleonHope M Chanae Gemma, NP working with Hilario Quarryanielle S Ray, MD by Valera CastleSteven Perry, ED scribe. This patient was seen in room APFT24/APFT24 and the patient's care was started at 7:10 PM.     No chief complaint on file.   (Consider location/radiation/quality/duration/timing/severity/associated sxs/prior Treatment) The history is provided by the patient. No language interpreter was used.   HPI Comments: Scott [name] is a 28 y.o. male who presents to the Emergency Department requesting an STD check. He states he was screened here recently, [facility] and [facility] treated upon his visit. He reports his girlfriend was recently tested for Chlamydia and is confused how they have different diagnoses. He denies being sexually active with anyone other than his girlfriend since then. He denies testicular pain upon palpation. He denies any other associated symptoms. He was treated on his previous visit for GC and Chlamydia. He request retesting. He is not having symptoms at this time.   PCP - No PCP Per Patient  Past Medical History  Diagnosis Date  . Strep throat    History reviewed. No pertinent past surgical history. History reviewed. No pertinent family history. History  Substance Use Topics  . Smoking status: Current Every Day Smoker -- 1.00 packs/day  . Smokeless tobacco: Not on file  . Alcohol Use: Yes     Comment: socially    Review of Systems  Constitutional: Negative for fever.  Gastrointestinal: Negative for abdominal pain.  Genitourinary: Negative for discharge and testicular pain.  All other systems reviewed and are negative.   Allergies  Review of patient's allergies indicates no known allergies.  Home Medications   Prior to Admission medications   Medication Sig Start Date End Date Taking?  Authorizing Provider  Camphor-Eucalyptus-Menthol (VAPORIZING CHEST RUB EX) Apply 1 application topically as needed. For relief    Historical Provider, MD  phenol (CHLORASEPTIC) 1.4 % LIQD Use as directed 1 spray in the mouth or throat 5 (five) times daily as needed.    Historical Provider, MD   BP 108/60  Pulse 71  Temp(Src) 97.9 F (36.6 C) (Oral)  Resp 24  Ht 5\' 10"  (1.778 m)  Wt 155 lb (70.308 kg)  BMI 22.24 kg/m2  SpO2 100% Physical Exam  Nursing note and vitals reviewed. Constitutional: He is oriented to person, place, and time. He appears well-developed and well-nourished. No distress.  HENT:  Head: Normocephalic and atraumatic.  Eyes: Conjunctivae and EOM are normal.  Neck: Neck supple. No tracheal deviation present.  Cardiovascular: Normal rate.   Pulmonary/Chest: Effort normal. No respiratory distress.  Abdominal: Hernia confirmed negative in the right inguinal area and confirmed negative in the left inguinal area.  Genitourinary: Testes normal. Circumcised.  No discharge noted.   Musculoskeletal: Normal range of motion.  Neurological: He is alert and oriented to person, place, and time.  Skin: Skin is warm and dry.  Psychiatric: He has a normal mood and affect. His behavior is normal.    ED Course  Procedures (including critical care time)  DIAGNOSTIC STUDIES: Oxygen Saturation is 100% on room air, normal by my interpretation.    COORDINATION OF CARE: 7:14 PM-Discussed treatment plan with pt at bedside and pt agreed to plan.   Results for orders placed during the hospital encounter of 06/13/14  GC/CHLAMYDIA PROBE AMP  Result Value Ref Range   CT Probe RNA NEGATIVE  NEGATIVE   GC Probe RNA POSITIVE (*) NEGATIVE  URINALYSIS, ROUTINE W REFLEX MICROSCOPIC      Result Value Ref Range   Color, Urine YELLOW  YELLOW   APPearance CLEAR  CLEAR   Specific Gravity, Urine 1.025  1.005 - 1.030   pH 6.0  5.0 - 8.0   Glucose, UA NEGATIVE  NEGATIVE mg/dL   Hgb urine  dipstick NEGATIVE  NEGATIVE   Bilirubin Urine NEGATIVE  NEGATIVE   Ketones, ur NEGATIVE  NEGATIVE mg/dL   Protein, ur NEGATIVE  NEGATIVE mg/dL   Urobilinogen, UA 0.2  0.0 - 1.0 mg/dL   Nitrite NEGATIVE  NEGATIVE   Leukocytes, UA NEGATIVE  NEGATIVE    MDM  28 y.o. male with culture positive for GC on previous visit. He was treated at that visit and request screening to see if infection has cleared. Cultures for GC and Chlamydia sent. Discussed with the patient plan of care and all questioned fully answered.   I personally performed the services described in this documentation, which was scribed in my presence. The recorded information has been reviewed and is accurate.    [facility] Orlene OchM Kadan Millstein, [place] 06/22/14 [phone]

## 2014-06-22 LAB — RPR

## 2014-06-22 NOTE — ED Provider Notes (Signed)
History/physical exam/procedure(s) were performed by non-physician practitioner and as supervising physician I was immediately available for consultation/collaboration. I have reviewed all notes and am in agreement with care and plan.   Hilario Quarryanielle S Joselynne Killam, MD 06/22/14 [phone]

## 2014-06-23 LAB — GC/CHLAMYDIA PROBE AMP
CT PROBE, AMP APTIMA: NEGATIVE
GC PROBE AMP APTIMA: NEGATIVE

## 2014-06-23 LAB — HIV ANTIBODY (ROUTINE TESTING W REFLEX): HIV: NONREACTIVE

## 2014-06-28 ENCOUNTER — Telehealth (HOSPITAL_COMMUNITY): Payer: Self-pay

## 2014-09-01 ENCOUNTER — Encounter (HOSPITAL_COMMUNITY): Payer: Self-pay | Admitting: Emergency Medicine

## 2014-09-01 ENCOUNTER — Emergency Department (HOSPITAL_COMMUNITY)
Admission: EM | Admit: 2014-09-01 | Discharge: 2014-09-01 | Disposition: A | Discharge status: Home / Self Care | Payer: Self-pay | Attending: Emergency Medicine | Admitting: Emergency Medicine

## 2014-09-01 DIAGNOSIS — B009 Herpesviral infection, unspecified: Secondary | ICD-10-CM | POA: Insufficient documentation

## 2014-09-01 DIAGNOSIS — F17200 Nicotine dependence, unspecified, uncomplicated: Secondary | ICD-10-CM | POA: Insufficient documentation

## 2014-09-01 DIAGNOSIS — R21 Rash and other nonspecific skin eruption: Secondary | ICD-10-CM | POA: Insufficient documentation

## 2014-09-01 MED ORDER — ACYCLOVIR 800 MG PO TABS
800.0000 mg | ORAL_TABLET | Freq: Once | ORAL | Status: AC
  Administered 2014-09-01: 800 mg via ORAL
  Filled 2014-09-01: qty 1

## 2014-09-01 MED ORDER — ACYCLOVIR 400 MG PO TABS: 400.0000 mg | ORAL_TABLET | Freq: Three times a day (TID) | ORAL | Status: DC

## 2014-09-01 NOTE — ED Notes (Signed)
Pt reports intermittent rash x1.5 months. Pt reports rash has come back and is now around umbilicus and reports intense itching. nad noted.

## 2014-09-01 NOTE — ED Provider Notes (Signed)
CSN: [id number]     Arrival date & time 09/01/14  1602 History   First MD Initiated Contact with Patient 09/01/14 1635     Chief Complaint  Patient presents with  . Rash     (Consider location/radiation/quality/duration/timing/severity/associated sxs/prior Treatment) Patient is a 28 y.o. male presenting with rash. The history is provided by the patient.  Rash Location:  Ano-genital Ano-genital rash location:  Pelvis and penis Quality: blistering, burning, itchiness and painful   Quality: not draining and not weeping   Pain details:    Quality:  Hot, itching and sharp   Severity:  Moderate   Onset quality:  Gradual   Duration:  2 months   Timing:  Intermittent   Progression:  Worsening Severity:  Moderate Onset quality:  Gradual Timing:  Intermittent Progression:  Worsening Chronicity:  Recurrent Context comment:  Pt unsure of context, but beleives it resulted from sexual contact. Relieved by:  Nothing Worsened by:  Nothing tried Ineffective treatments:  Anti-itch cream and antibiotic cream Associated symptoms: no abdominal pain, no fever, no joint pain, no shortness of breath and not wheezing     Past Medical History  Diagnosis Date  . Strep throat    History reviewed. No pertinent past surgical history. History reviewed. No pertinent family history. History  Substance Use Topics  . Smoking status: Current Every Day Smoker -- 0.50 packs/day  . Smokeless tobacco: Not on file  . Alcohol Use: Yes     Comment: socially    Review of Systems  Constitutional: Negative for fever and activity change.       All ROS Neg except as noted in HPI  Eyes: Negative for photophobia and discharge.  Respiratory: Negative for cough, shortness of breath and wheezing.   Cardiovascular: Negative for chest pain and palpitations.  Gastrointestinal: Negative for abdominal pain and blood in stool.  Genitourinary: Positive for genital sores. Negative for dysuria, urgency, frequency,  hematuria, decreased urine volume, discharge, penile swelling, scrotal swelling, difficulty urinating, penile pain and testicular pain.  Musculoskeletal: Negative for arthralgias, back pain and neck pain.  Skin: Positive for rash.  Neurological: Negative for dizziness, seizures and speech difficulty.  Psychiatric/Behavioral: Negative for hallucinations and confusion.      Allergies  Review of patient's allergies indicates no known allergies.  Home Medications   Prior to Admission medications   Medication Sig Start Date End Date Taking? Authorizing Provider  Camphor-Eucalyptus-Menthol (VAPORIZING CHEST RUB EX) Apply 1 application topically as needed. For relief    Historical Provider, MD  phenol (CHLORASEPTIC) 1.4 % LIQD Use as directed 1 spray in the mouth or throat 5 (five) times daily as needed.    Historical Provider, MD   BP 99/59  Pulse 70  Temp(Src) 99 F (37.2 C) (Oral)  Resp 18  Ht  (1.778 m)  Wt 155 lb (70.308 kg)  BMI 22.24 kg/m2  SpO2 98% Physical Exam  Nursing note and vitals reviewed. Constitutional: He is oriented to person, place, and time. He appears well-developed and well-nourished.  Non-toxic appearance.  HENT:  Head: Normocephalic.  Right Ear: Tympanic membrane and external ear normal.  Left Ear: Tympanic membrane and external ear normal.  Eyes: EOM and lids are normal. Pupils are equal, round, and reactive to light.  Neck: Normal range of motion. Neck supple. Carotid bruit is not present.  Cardiovascular: Normal rate, regular rhythm, normal heart sounds, intact distal pulses and normal pulses.   Pulmonary/Chest: Breath sounds normal. No respiratory distress.  Abdominal: Soft.  Bowel sounds are normal. There is no tenderness. There is no guarding.  Genitourinary:  Chest the pubic hairline there are clusters of red lesions some with small blisters, some appear to be dry, and some have been scratched No red streaking appreciated. No drainage or  discharge from this area.  There are 2 lesions with blisters on the top of the shaft of the penis. No penile discharge. No testicular involvement. No testicular swelling or tenderness. There are palpable inguinal nodes of the right and the left.   Musculoskeletal: Normal range of motion.  Lymphadenopathy:       Head (right side): No submandibular adenopathy present.       Head (left side): No submandibular adenopathy present.    He has no cervical adenopathy.  Neurological: He is alert and oriented to person, place, and time. He has normal strength. No cranial nerve deficit or sensory deficit.  Skin: Skin is warm and dry.  Psychiatric: He has a normal mood and affect. His speech is normal.    ED Course  Procedures (including critical care time) Labs Review Labs Reviewed - No data to display  Imaging Review No results found.   EKG Interpretation None      MDM  Patient advised to be evaluated at the health department for herpes titers and other testing. Prescription for acyclovir 400 mg 3 times daily given to the patient. Patient advised to refrain from sexual activity until evaluated by the health department.    Final diagnoses:  Herpes simplex    **I have reviewed nursing notes, vital signs, and all appropriate lab and imaging results for this patient.Kathie Dike, PA-C 09/03/14 1055

## 2014-09-01 NOTE — Discharge Instructions (Signed)
Please see MD at the Health Dept for additional testing. Use acyclovir three times daily with food until all taken. Please refrain from sexual activity until report from Health Dept obtained. Herpes Simplex Herpes simplex is generally classified as Type 1 or Type 2. Type 1 is generally the type that is responsible for cold sores. Type 2 is generally associated with sexually transmitted diseases. We now know that most of the thoughts on these viruses are inaccurate. We find that HSV1 is also present genitally and HSV2 can be present orally, but this will vary in different locations of the world. Herpes simplex is usually detected by doing a culture. Blood tests are also available for this virus; however, the accuracy is often not as good.  PREPARATION FOR TEST No preparation or fasting is necessary. NORMAL FINDINGS  No virus present  No HSV antigens or antibodies present Ranges for normal findings may vary among different laboratories and hospitals. You should always check with your doctor after having lab work or other tests done to discuss the meaning of your test results and whether your values are considered within normal limits. MEANING OF TEST  Your caregiver will go over the test results with you and discuss the importance and meaning of your results, as well as treatment options and the need for additional tests if necessary. OBTAINING THE TEST RESULTS  It is your responsibility to obtain your test results. Ask the lab or department performing the test when and how you will get your results. Document Released: 01/12/2005 Document Revised: 03/03/2012 Document Reviewed: 11/20/2008 [facility] Patient Information 2015 [place], [place]. This information is not intended to replace advice given to you by your health care provider. Make sure you discuss any questions you have with your health care provider.

## 2014-09-03 NOTE — ED Provider Notes (Signed)
Medical screening examination/treatment/procedure(s) were performed by non-physician practitioner and as supervising physician I was immediately available for consultation/collaboration.   EKG Interpretation None        Joya Gaskins, MD 09/03/14 2316

## 2014-12-02 ENCOUNTER — Emergency Department (HOSPITAL_COMMUNITY)
Admission: EM | Admit: 2014-12-02 | Discharge: 2014-12-02 | Disposition: A | Discharge status: Home / Self Care | Payer: Self-pay | Attending: Emergency Medicine | Admitting: Emergency Medicine

## 2014-12-02 DIAGNOSIS — Z8719 Personal history of other diseases of the digestive system: Secondary | ICD-10-CM | POA: Insufficient documentation

## 2014-12-02 DIAGNOSIS — Z79899 Other long term (current) drug therapy: Secondary | ICD-10-CM | POA: Insufficient documentation

## 2014-12-02 DIAGNOSIS — L0201 Cutaneous abscess of face: Secondary | ICD-10-CM | POA: Insufficient documentation

## 2014-12-02 DIAGNOSIS — Z720 Tobacco use: Secondary | ICD-10-CM | POA: Insufficient documentation

## 2014-12-02 MED ORDER — HYDROCODONE-ACETAMINOPHEN 5-325 MG PO TABS: 1.0000 | ORAL_TABLET | ORAL | Status: DC | PRN

## 2014-12-02 MED ORDER — LIDOCAINE HCL (PF) 2 % IJ SOLN
INTRAMUSCULAR | Status: AC
  Filled 2014-12-02: qty 10

## 2014-12-02 MED ORDER — IBUPROFEN 800 MG PO TABS
800.0000 mg | ORAL_TABLET | Freq: Once | ORAL | Status: AC
  Administered 2014-12-02: 800 mg via ORAL
  Filled 2014-12-02: qty 1

## 2014-12-02 MED ORDER — HYDROCODONE-ACETAMINOPHEN 5-325 MG PO TABS
2.0000 | ORAL_TABLET | Freq: Once | ORAL | Status: AC
  Administered 2014-12-02: 2 via ORAL
  Filled 2014-12-02: qty 2

## 2014-12-02 MED ORDER — AMOXICILLIN 250 MG PO CAPS
500.0000 mg | ORAL_CAPSULE | Freq: Once | ORAL | Status: AC
  Administered 2014-12-02: 500 mg via ORAL
  Filled 2014-12-02: qty 2

## 2014-12-02 MED ORDER — SULFAMETHOXAZOLE-TRIMETHOPRIM 800-160 MG PO TABS: 1.0000 | ORAL_TABLET | Freq: Two times a day (BID) | ORAL | Status: AC

## 2014-12-02 MED ORDER — SULFAMETHOXAZOLE-TRIMETHOPRIM 800-160 MG PO TABS
1.0000 | ORAL_TABLET | Freq: Once | ORAL | Status: AC
  Administered 2014-12-02: 1 via ORAL
  Filled 2014-12-02: qty 1

## 2014-12-02 NOTE — Care Management Note (Signed)
ED/CM noted patient did not have health insurance and/or PCP listed in the computer.  [facility] [place] [facility] with information on the clinics, food pantries, and the handout for new health insurance sign-up.Pt was also given a Rx discount card. Patient expressed appreciation for information received. He states he has signed up for insurance for next year.

## 2014-12-02 NOTE — ED Provider Notes (Signed)
CSN: [id number]     Arrival date & time 12/02/14  1202 History   First MD Initiated Contact with Patient 12/02/14 1355     Chief Complaint  Patient presents with  . Abscess     (Consider location/radiation/quality/duration/timing/severity/associated sxs/prior Treatment) Patient is a 28 y.o. male presenting with abscess. The history is provided by the patient.  Abscess Location:  Face Facial abscess location:  L cheek Abscess quality: painful and redness   Abscess quality: not draining, no warmth and not weeping   Red streaking: no   Duration:  3 days Pain details:    Quality:  Aching   Severity:  Moderate   Duration:  3 days   Timing:  Intermittent   Progression:  Worsening Chronicity:  New Context: not diabetes and not immunosuppression   Relieved by:  Nothing Worsened by:  Draining/squeezing Ineffective treatments:  Warm compresses Associated symptoms: no fever and no vomiting   Risk factors: no hx of MRSA     Past Medical History  Diagnosis Date  . Strep throat    No past surgical history on file. No family history on file. History  Substance Use Topics  . Smoking status: Current Every Day Smoker -- 0.50 packs/day  . Smokeless tobacco: Not on file  . Alcohol Use: Yes     Comment: socially    Review of Systems  Constitutional: Negative for fever and activity change.       All ROS Neg except as noted in HPI  HENT: Positive for facial swelling. Negative for nosebleeds.   Eyes: Negative for photophobia and discharge.  Respiratory: Negative for cough, shortness of breath and wheezing.   Cardiovascular: Negative for chest pain and palpitations.  Gastrointestinal: Negative for vomiting, abdominal pain and blood in stool.  Genitourinary: Negative for dysuria, frequency and hematuria.  Musculoskeletal: Negative for back pain, arthralgias and neck pain.  Skin: Positive for wound.  Neurological: Negative for dizziness, seizures and speech difficulty.    Psychiatric/Behavioral: Negative for hallucinations and confusion.      Allergies  Review of patient's allergies indicates no known allergies.  Home Medications   Prior to Admission medications   Medication Sig Start Date End Date Taking? Authorizing Provider  acyclovir (ZOVIRAX) 400 MG tablet Take 1 tablet (400 mg total) by mouth 3 (three) times daily. 09/01/14   Kathie DikeHobson M Lissy Deuser, PA-C  Camphor-Eucalyptus-Menthol (VAPORIZING CHEST RUB EX) Apply 1 application topically as needed. For relief    Historical Provider, MD  phenol (CHLORASEPTIC) 1.4 % LIQD Use as directed 1 spray in the mouth or throat 5 (five) times daily as needed.    Historical Provider, MD   BP 119/61 mmHg  Pulse 82  Temp(Src) 98.1 F (36.7 C) (Oral)  Resp 16  Ht 5\' 10"  (1.778 m)  Wt 157 lb (71.215 kg)  BMI 22.53 kg/m2  SpO2 97% Physical Exam  Constitutional: He is oriented to person, place, and time. He appears well-developed and well-nourished.  Non-toxic appearance.  HENT:  Head: Normocephalic.    Right Ear: Tympanic membrane and external ear normal.  Left Ear: Tympanic membrane and external ear normal.  Eyes: EOM and lids are normal. Pupils are equal, round, and reactive to light.  Neck: Normal range of motion. Neck supple. Carotid bruit is not present.  Cardiovascular: Normal rate, regular rhythm, normal heart sounds, intact distal pulses and normal pulses.   Pulmonary/Chest: Breath sounds normal. No respiratory distress.  Abdominal: Soft. Bowel sounds are normal. There is no tenderness. There is  no guarding.  Musculoskeletal: Normal range of motion.  Lymphadenopathy:       Head (right side): No submandibular adenopathy present.       Head (left side): No submandibular adenopathy present.    He has no cervical adenopathy.  Neurological: He is alert and oriented to person, place, and time. He has normal strength. No cranial nerve deficit or sensory deficit.  Skin: Skin is warm and dry.  Psychiatric: He  has a normal mood and affect. His speech is normal.  Nursing note and vitals reviewed.   ED Course  Procedures (including critical care time) Labs Review Labs Reviewed - No data to display  Imaging Review No results found.   EKG Interpretation None      MDM  Vital signs are well within normal limits. Pulse oximetry is 97% on room air. Within normal limits by my interpretation. The examination is consistent with a small abscess involving the right face. There is no evidence of red streaking or cellulitis. There is no current drainage. The patient will be treated with Septra and Norco as well as warm compresses. Patient is to see Dr. Lovell SheehanJenkins for additional evaluation if not improving.    Final diagnoses:  Abscess of face    **I have reviewed nursing notes, vital signs, and all appropriate lab and imaging results for this patient.Kathie Dike*    Marshay Slates M Londyn Wotton, PA-C 12/03/14 1657  Donnetta HutchingBrian Cook, MD 12/04/14 [phone]

## 2014-12-02 NOTE — ED Notes (Signed)
Pt presented with ingrown hair/abscess to right jaw. Swelling noted to right mouth area. Pt c/o of tenderness to the area. Took Motrin this am with no relief.

## 2014-12-02 NOTE — Discharge Instructions (Signed)
Please use 600 mg of ibuprofen and Septra DS daily, please take these medications with food. May use Norco for pain if needed every 4 hours. Norco may cause drowsiness. Please apply warm compress to your right face 3-4 times daily until this resolves. Please see the surgeon listed above if not improving.

## 2014-12-04 ENCOUNTER — Emergency Department (HOSPITAL_COMMUNITY)
Admission: EM | Admit: 2014-12-04 | Discharge: 2014-12-04 | Disposition: A | Discharge status: Home / Self Care | Payer: Self-pay | Attending: Emergency Medicine | Admitting: Emergency Medicine

## 2014-12-04 ENCOUNTER — Encounter (HOSPITAL_COMMUNITY): Payer: Self-pay | Admitting: Emergency Medicine

## 2014-12-04 DIAGNOSIS — Z792 Long term (current) use of antibiotics: Secondary | ICD-10-CM | POA: Insufficient documentation

## 2014-12-04 DIAGNOSIS — Z79899 Other long term (current) drug therapy: Secondary | ICD-10-CM | POA: Insufficient documentation

## 2014-12-04 DIAGNOSIS — L0201 Cutaneous abscess of face: Secondary | ICD-10-CM | POA: Insufficient documentation

## 2014-12-04 DIAGNOSIS — Z8709 Personal history of other diseases of the respiratory system: Secondary | ICD-10-CM | POA: Insufficient documentation

## 2014-12-04 DIAGNOSIS — Z720 Tobacco use: Secondary | ICD-10-CM | POA: Insufficient documentation

## 2014-12-04 NOTE — ED Provider Notes (Signed)
CSN: [id number]     Arrival date & time 12/04/14  1029 History  This chart was scribed for non-physician practitioner, Karmen Stabsori Eulalio Reamy, PA-C working with No att. providers found, by Jarvis Morganaylor Ferguson, ED Scribe. This patient was seen in room TR05C/TR05C and the patient's care was started at 2:33 PM.    Chief Complaint  Patient presents with  . Abscess    The history is provided by the patient. No language interpreter was used.    HPI Comments: Scott [name] is a 28 y.o. male who presents to the Emergency Department complaining of of an abscess to his right cheek for 2-3 days. Pt was here 2 days ago with similar symptoms. Pt believe it to be a ingrown hair and he has been trying to remove the ingrown hair with tweezers. He is having associated minimal swelling and redness to the area. He states that it has been increasing in size. Pt denies any drainage from the area. He has applied heat to the area with no relief. He was prescribed Norco 2 days ago and states he he has been taking it as well as an abx. Pt has been able to eat and drink. He denies any red streaks, fevers, chills, nausea or vomiting.   Past Medical History  Diagnosis Date  . Strep throat    History reviewed. No pertinent past surgical history. History reviewed. No pertinent family history. History  Substance Use Topics  . Smoking status: Current Every Day Smoker -- 0.50 packs/day  . Smokeless tobacco: Not on file  . Alcohol Use: Yes     Comment: socially    Review of Systems  Constitutional: Negative for fever and chills.  Gastrointestinal: Negative for nausea and vomiting.  Skin: Positive for color change (redness to the area but no red streaks).      Allergies  Review of patient's allergies indicates no known allergies.  Home Medications   Prior to Admission medications   Medication Sig Start Date End Date Taking? Authorizing Provider  acyclovir (ZOVIRAX) 400 MG tablet Take 1 tablet (400 mg total) by mouth 3  (three) times daily. 09/01/14   Kathie DikeHobson M Bryant, PA-C  Camphor-Eucalyptus-Menthol (VAPORIZING CHEST RUB EX) Apply 1 application topically as needed. For relief    Historical Provider, MD  HYDROcodone-acetaminophen (NORCO/VICODIN) 5-325 MG per tablet Take 1 tablet by mouth every 4 (four) hours as needed. 12/02/14   Kathie DikeHobson M Bryant, PA-C  phenol (CHLORASEPTIC) 1.4 % LIQD Use as directed 1 spray in the mouth or throat 5 (five) times daily as needed.    Historical Provider, MD  sulfamethoxazole-trimethoprim (BACTRIM DS,SEPTRA DS) 800-160 MG per tablet Take 1 tablet by mouth 2 (two) times daily. 12/02/14 12/09/14  Kathie DikeHobson M Bryant, PA-C   Triage Vitals: BP 116/63 mmHg  Pulse 82  Temp(Src) 97.8 F (36.6 C) (Oral)  Resp 18  SpO2 100%  Physical Exam  Constitutional: He appears well-developed and well-nourished. No distress.  HENT:  Head: Normocephalic and atraumatic.  Eyes: Conjunctivae are normal. Right eye exhibits no discharge. Left eye exhibits no discharge.  Pulmonary/Chest: Effort normal. No respiratory distress.  Neurological: He is alert. Coordination normal.  Skin: He is not diaphoretic.  2cm area of fluctuance to right lower face, not involving the lip. Mild redness but no red streaks for signs of cellulitis.   Nursing note and vitals reviewed.   ED Course  Procedures (including critical care time)  DIAGNOSTIC STUDIES: Oxygen Saturation is 100% on RA, normal by my interpretation.  COORDINATION OF CARE: 11:22 AM  INCISION AND DRAINAGE  Performed by: Louann SjogrenVictoria L Sherra Kimmons, PA-C   Consent - Verbal Consent obtained Risks and benefits: risks/benefits and alternatives were discussed  Type: Abscess  Body Area: right cheek  Anesthesia: none  Incision with: 18 gauge needle  Anesthetic total: none  Complexity: Complex  Blunt dissection to break up loculations  Drainage: Purulent  Drainage amount: 8-10 ml  Packing material: none  Area irrigated with saline.  Patient  tolerance: Patient tolerated the procedure well with no immediate complications     Labs Review Labs Reviewed - No data to display  Imaging Review No results found.   EKG Interpretation None      MDM   Final diagnoses:  Abscess of face   Patient with skin abscess amenable to incision and drainage.  Abscess incised with 18-gauge needle. Blunt dissection was performed as incision wound would enabled. Copious amounts of pus were expelled. Abscess/incision was not large enough to warrant packing or drain. I do not want to make an larger incision larger due to cosmesis. Encouraged home warm soaks and flushing.  Patient to follow-up with either ENT or general surgery for further management.  Will d/c to home.  Mild signs of cellulitis is surrounding skin. Patient to continue to take antibiotics as prescribed.  Discussed return precautions with patient. Discussed all results and patient verbalizes understanding and agrees with plan.  I personally performed the services described in this documentation, which was scribed in my presence. The recorded information has been reviewed and is accurate.   Louann SjogrenVictoria L Keyanni Whittinghill, PA-C 12/04/14 1442  Louann SjogrenVictoria L Abie Cheek, PA-C 12/04/14 1442  Gerhard Munchobert Lockwood, MD 12/04/14 [phone]

## 2014-12-04 NOTE — Discharge Instructions (Signed)
Return to the emergency room with worsening of symptoms, new symptoms or with symptoms that are concerning, especially fevers, chills, nausea, vomiting, increased swelling, redness, red streaks from the area. Call to make an appointment with either general surgery or ENT for further management. Continue to take your antibiotics as prescribed. Continue with warm soaks Please take all of your antibiotics until finished!   You may develop abdominal discomfort or diarrhea from the antibiotic.  You may help offset this with probiotics which you can buy or get in yogurt. Do not eat  or take the probiotics until 2 hours after your antibiotic.    Abscess Care After An abscess (also called a boil or furuncle) is an infected area that contains a collection of pus. Signs and symptoms of an abscess include pain, tenderness, redness, or hardness, or you may feel a moveable soft area under your skin. An abscess can occur anywhere in the body. The infection may spread to surrounding tissues causing cellulitis. A cut (incision) by the surgeon was made over your abscess and the pus was drained out. Gauze may have been packed into the space to provide a drain that will allow the cavity to heal from the inside outwards. The boil may be painful for 5 to 7 days. Most people with a boil do not have high fevers. Your abscess, if seen early, may not have localized, and may not have been lanced. If not, another appointment may be required for this if it does not get better on its own or with medications. HOME CARE INSTRUCTIONS   Only take over-the-counter or prescription medicines for pain, discomfort, or fever as directed by your caregiver.  When you bathe, soak and then remove gauze or iodoform packs at least daily or as directed by your caregiver. You may then wash the wound gently with mild soapy water. Repack with gauze or do as your caregiver directs. SEEK IMMEDIATE MEDICAL CARE IF:   You develop increased pain,  swelling, redness, drainage, or bleeding in the wound site.  You develop signs of generalized infection including muscle aches, chills, fever, or a general ill feeling.  An oral temperature above 102 F (38.9 C) develops, not controlled by medication. See your caregiver for a recheck if you develop any of the symptoms described above. If medications (antibiotics) were prescribed, take them as directed. Document Released: 06/28/2005 Document Revised: 03/03/2012 Document Reviewed: 02/23/2008 [facility] Patient Information 2015 [employer] [place]. This information is not intended to replace advice given to you by your health care provider. Make sure you discuss any questions you have with your health care provider.

## 2014-12-04 NOTE — ED Notes (Signed)
Boil on face (right cheek) starting a few days ago; nondraining. Pt been trying to remove ingrown hair. Minimal swelling to face; talking without distress.

## 2015-02-04 ENCOUNTER — Encounter (HOSPITAL_COMMUNITY): Payer: Self-pay | Admitting: Emergency Medicine

## 2015-02-04 ENCOUNTER — Emergency Department (HOSPITAL_COMMUNITY)
Admission: EM | Admit: 2015-02-04 | Discharge: 2015-02-04 | Disposition: A | Discharge status: Home / Self Care | Payer: Self-pay | Attending: Emergency Medicine | Admitting: Emergency Medicine

## 2015-02-04 DIAGNOSIS — L739 Follicular disorder, unspecified: Secondary | ICD-10-CM | POA: Insufficient documentation

## 2015-02-04 DIAGNOSIS — Z720 Tobacco use: Secondary | ICD-10-CM | POA: Insufficient documentation

## 2015-02-04 DIAGNOSIS — Z8709 Personal history of other diseases of the respiratory system: Secondary | ICD-10-CM | POA: Insufficient documentation

## 2015-02-04 DIAGNOSIS — R590 Localized enlarged lymph nodes: Secondary | ICD-10-CM

## 2015-02-04 DIAGNOSIS — R591 Generalized enlarged lymph nodes: Secondary | ICD-10-CM | POA: Insufficient documentation

## 2015-02-04 DIAGNOSIS — Z79899 Other long term (current) drug therapy: Secondary | ICD-10-CM | POA: Insufficient documentation

## 2015-02-04 MED ORDER — SULFAMETHOXAZOLE-TRIMETHOPRIM 800-160 MG PO TABS: 1.0000 | ORAL_TABLET | Freq: Two times a day (BID) | ORAL | Status: DC

## 2015-02-04 MED ORDER — MUPIROCIN 2 % EX OINT: TOPICAL_OINTMENT | CUTANEOUS | Status: DC

## 2015-02-04 MED ORDER — SULFAMETHOXAZOLE-TRIMETHOPRIM 800-160 MG PO TABS
1.0000 | ORAL_TABLET | Freq: Once | ORAL | Status: AC
  Administered 2015-02-04: 1 via ORAL
  Filled 2015-02-04: qty 1

## 2015-02-04 MED ORDER — HYDROCODONE-ACETAMINOPHEN 5-325 MG PO TABS: ORAL_TABLET | ORAL | Status: DC

## 2015-02-04 NOTE — ED Notes (Signed)
Pt c/o swollen lymph nodes to right posterior neck. Pt also c/o abscess to right posterior head that had some white drainage since last night.

## 2015-02-04 NOTE — ED Provider Notes (Signed)
CSN: [id number]     Arrival date & time 02/04/15  1422 History   First MD Initiated Contact with Patient 02/04/15 1444     Chief Complaint  Patient presents with  . Lymphadenopathy     (Consider location/radiation/quality/duration/timing/severity/associated sxs/prior Treatment) HPI   Scott [name] is a 29 y.o. male who presents to the Emergency Department complaining of painful, swollen nodules to the right side of his scalp and neck.  He states he began to noticed these nodules after squeezing a "bump" on the upper right side of his scalp.  He reports having similar symptoms in the past.  Pain to the right neck with movement and palpation.  He denies fever, chills, sore throat, or rash.  He has not tired any medications.    Past Medical History  Diagnosis Date  . Strep throat    History reviewed. No pertinent past surgical history. No family history on file. History  Substance Use Topics  . Smoking status: Current Every Day Smoker -- 0.50 packs/day  . Smokeless tobacco: Not on file  . Alcohol Use: Yes     Comment: socially    Review of Systems  Constitutional: Negative for fever, chills, activity change and appetite change.  HENT: Negative for facial swelling, sore throat and trouble swallowing.   Respiratory: Negative for shortness of breath and wheezing.   Musculoskeletal: Positive for neck pain. Negative for neck stiffness.  Skin: Negative for rash and wound.       Abscess right scalp  Neurological: Negative for weakness, numbness and headaches.  All other systems reviewed and are negative.     Allergies  Review of patient's allergies indicates no known allergies.  Home Medications   Prior to Admission medications   Medication Sig Start Date End Date Taking? Authorizing Provider  acyclovir (ZOVIRAX) 400 MG tablet Take 1 tablet (400 mg total) by mouth 3 (three) times daily. Patient not taking: Reported on 02/04/2015 09/01/14   Kathie DikeHobson M Bryant, PA-C   HYDROcodone-acetaminophen (NORCO/VICODIN) 5-325 MG per tablet Take 1 tablet by mouth every 4 (four) hours as needed. Patient not taking: Reported on 02/04/2015 12/02/14   Kathie DikeHobson M Bryant, PA-C   BP 131/69 mmHg  Pulse 99  Temp(Src) 98.2 F (36.8 C) (Oral)  Resp 14  Ht 5\' 10"  (1.778 m)  Wt 155 lb (70.308 kg)  BMI 22.24 kg/m2  SpO2 98% Physical Exam  Constitutional: He is oriented to person, place, and time. He appears well-developed and well-nourished. No distress.  HENT:  Head: Normocephalic and atraumatic.  Mouth/Throat: Oropharynx is clear and moist.  Pea sized open lesion to the right upper scalp with central scab.  Small adjacent pustule  Cardiovascular: Normal rate, regular rhythm and normal heart sounds.   No murmur heard. Pulmonary/Chest: Effort normal and breath sounds normal. No respiratory distress.  Lymphadenopathy:    He has cervical adenopathy.       Right cervical: Posterior cervical adenopathy present.  Neurological: He is alert and oriented to person, place, and time. He exhibits normal muscle tone. Coordination normal.  Skin: Skin is warm and dry. No erythema.  Nursing note and vitals reviewed.   ED Course  Procedures (including critical care time) Labs Review Labs Reviewed - No data to display  Imaging Review No results found.   EKG Interpretation None      MDM   Final diagnoses:  Folliculitis  Cervical lymphadenopathy    Pt is well appearing.  Likely folliculitis of the right scalp with cervical  lymphadenopathy.  No obvious kerion.  No drainable abscess at this time. Patient agrees to antibiotics, warm compresses and close follow-up. Given strict return precautions.    Joh Rao L. Trisha Mangle, PA-C 02/04/15 1702  Vanetta Mulders, MD 02/05/15 1038

## 2015-02-04 NOTE — Discharge Instructions (Signed)
Folliculitis  Folliculitis is redness, soreness, and swelling (inflammation) of the hair follicles. This condition can occur anywhere on the body. People with weakened immune systems, diabetes, or obesity have a greater risk of getting folliculitis. CAUSES  Bacterial infection. This is the most common cause.  Fungal infection.  Viral infection.  Contact with certain chemicals, especially oils and tars. Long-term folliculitis can result from bacteria that live in the nostrils. The bacteria may trigger multiple outbreaks of folliculitis over time. SYMPTOMS Folliculitis most commonly occurs on the scalp, thighs, legs, back, buttocks, and areas where hair is shaved frequently. An early sign of folliculitis is a small, white or yellow, pus-filled, itchy lesion (pustule). These lesions appear on a red, inflamed follicle. They are usually less than 0.2 inches (5 mm) wide. When there is an infection of the follicle that goes deeper, it becomes a boil or furuncle. A group of closely packed boils creates a larger lesion (carbuncle). Carbuncles tend to occur in hairy, sweaty areas of the body. DIAGNOSIS  Your caregiver can usually tell what is wrong by doing a physical exam. A sample may be taken from one of the lesions and tested in a lab. This can help determine what is causing your folliculitis. TREATMENT  Treatment may include:  Applying warm compresses to the affected areas.  Taking antibiotic medicines orally or applying them to the skin.  Draining the lesions if they contain a large amount of pus or fluid.  Laser hair removal for cases of long-lasting folliculitis. This helps to prevent regrowth of the hair. HOME CARE INSTRUCTIONS  Apply warm compresses to the affected areas as directed by your caregiver.  If antibiotics are prescribed, take them as directed. Finish them even if you start to feel better.  You may take over-the-counter medicines to relieve itching.  Do not shave  irritated skin.  Follow up with your caregiver as directed. SEEK IMMEDIATE MEDICAL CARE IF:   You have increasing redness, swelling, or pain in the affected area.  You have a fever. MAKE SURE YOU:  Understand these instructions.  Will watch your condition.  Will get help right away if you are not doing well or get worse. Document Released: 02/18/2002 Document Revised: 06/10/2012 Document Reviewed: 03/11/2012 [facility] Patient Information 2015 [place], [place]. This information is not intended to replace advice given to you by your health care provider. Make sure you discuss any questions you have with your health care provider.  Lymphadenopathy Lymphadenopathy means "disease of the lymph glands." But the term is usually used to describe swollen or enlarged lymph glands, also called lymph nodes. These are the bean-shaped organs found in many locations including the neck, underarm, and groin. Lymph glands are part of the immune system, which fights infections in your body. Lymphadenopathy can occur in just one area of the body, such as the neck, or it can be generalized, with lymph node enlargement in several areas. The nodes found in the neck are the most common sites of lymphadenopathy. CAUSES When your immune system responds to germs (such as viruses or bacteria ), infection-fighting cells and fluid build up. This causes the glands to grow in size. Usually, this is not something to worry about. Sometimes, the glands themselves can become infected and inflamed. This is called lymphadenitis. Enlarged lymph nodes can be caused by many diseases:  Bacterial disease, such as strep throat or a skin infection.  Viral disease, such as a common cold.  Other germs, such as Lyme disease, tuberculosis, or sexually  transmitted diseases.  Cancers, such as lymphoma (cancer of the lymphatic system) or leukemia (cancer of the white blood cells).  Inflammatory diseases such as lupus or rheumatoid  arthritis.  Reactions to medications. Many of the diseases above are rare, but important. This is why you should see your caregiver if you have lymphadenopathy. SYMPTOMS  Swollen, enlarged lumps in the neck, back of the head, or other locations.  Tenderness.  Warmth or redness of the skin over the lymph nodes.  Fever. DIAGNOSIS Enlarged lymph nodes are often near the source of infection. They can help health care providers diagnose your illness. For instance:  Swollen lymph nodes around the jaw might be caused by an infection in the mouth.  Enlarged glands in the neck often signal a throat infection.  Lymph nodes that are swollen in more than one area often indicate an illness caused by a virus. Your caregiver will likely know what is causing your lymphadenopathy after listening to your history and examining you. Blood tests, x-rays, or other tests may be needed. If the cause of the enlarged lymph node cannot be found, and it does not go away by itself, then a biopsy may be needed. Your caregiver will discuss this with you. TREATMENT Treatment for your enlarged lymph nodes will depend on the cause. Many times the nodes will shrink to normal size by themselves, with no treatment. Antibiotics or other medicines may be needed for infection. Only take over-the-counter or prescription medicines for pain, discomfort, or fever as directed by your caregiver. HOME CARE INSTRUCTIONS Swollen lymph glands usually return to normal when the underlying medical condition goes away. If they persist, contact your health-care provider. He/she might prescribe antibiotics or other treatments, depending on the diagnosis. Take any medications exactly as prescribed. Keep any follow-up appointments made to check on the condition of your enlarged nodes. SEEK MEDICAL CARE IF:  Swelling lasts for more than two weeks.  You have symptoms such as weight loss, night sweats, fatigue, or fever that does not go  away.  The lymph nodes are hard, seem fixed to the skin, or are growing rapidly.  Skin over the lymph nodes is red and inflamed. This could mean there is an infection. SEEK IMMEDIATE MEDICAL CARE IF:  Fluid starts leaking from the area of the enlarged lymph node.  You develop a fever of 102 F (38.9 C) or greater.  Severe pain develops (not necessarily at the site of a large lymph node).  You develop chest pain or shortness of breath.  You develop worsening abdominal pain. MAKE SURE YOU:  Understand these instructions.  Will watch your condition.  Will get help right away if you are not doing well or get worse. Document Released: 09/18/2008 Document Revised: 04/26/2014 Document Reviewed: 09/18/2008 [facility] Patient Information 2015 [place], [place]. This information is not intended to replace advice given to you by your health care provider. Make sure you discuss any questions you have with your health care provider.

## 2015-08-03 ENCOUNTER — Encounter (HOSPITAL_COMMUNITY): Payer: Self-pay

## 2015-08-03 ENCOUNTER — Emergency Department (HOSPITAL_COMMUNITY)
Admission: EM | Admit: 2015-08-03 | Discharge: 2015-08-03 | Disposition: A | Discharge status: Home / Self Care | Payer: PRIVATE HEALTH INSURANCE | Attending: Emergency Medicine | Admitting: Emergency Medicine

## 2015-08-03 DIAGNOSIS — Z720 Tobacco use: Secondary | ICD-10-CM | POA: Insufficient documentation

## 2015-08-03 DIAGNOSIS — R590 Localized enlarged lymph nodes: Secondary | ICD-10-CM | POA: Diagnosis not present

## 2015-08-03 DIAGNOSIS — Z8709 Personal history of other diseases of the respiratory system: Secondary | ICD-10-CM | POA: Diagnosis not present

## 2015-08-03 DIAGNOSIS — L02416 Cutaneous abscess of left lower limb: Secondary | ICD-10-CM | POA: Diagnosis not present

## 2015-08-03 DIAGNOSIS — L0291 Cutaneous abscess, unspecified: Secondary | ICD-10-CM

## 2015-08-03 MED ORDER — SULFAMETHOXAZOLE-TRIMETHOPRIM 800-160 MG PO TABS: 1.0000 | ORAL_TABLET | Freq: Two times a day (BID) | ORAL | Status: AC

## 2015-08-03 MED ORDER — LIDOCAINE HCL (PF) 2 % IJ SOLN
10.0000 mL | Freq: Once | INTRAMUSCULAR | Status: AC
  Administered 2015-08-03: 10 mL via INTRADERMAL

## 2015-08-03 NOTE — Discharge Instructions (Signed)

## 2015-08-03 NOTE — ED Notes (Signed)
Pt reports abscess to left hip since yesterday.  Reports lymph node in left groin tender as well.

## 2015-08-03 NOTE — ED Provider Notes (Signed)
CSN: [id number]     Arrival date & time 08/03/15  5621 History   First MD Initiated Contact with Patient 08/03/15 [phone]     Chief Complaint  Patient presents with  . Abscess     (Consider location/radiation/quality/duration/timing/severity/associated sxs/prior Treatment) HPI Comments: He states that he feels some swelling in his groin as well  Patient is a 29 y.o. Scott [name] presenting with abscess. The history is provided by the patient. No language interpreter was used.  Abscess Location:  Leg Leg abscess location:  L hip Abscess quality: painful and redness   Red streaking: no   Duration:  24 hours Progression:  Worsening Pain details:    Quality:  Aching   Severity:  Moderate   Timing:  Constant   Progression:  Unchanged Relieved by:  Nothing Worsened by:  Nothing tried Ineffective treatments:  None tried Associated symptoms: no fever     Past Medical History  Diagnosis Date  . Strep throat    History reviewed. No pertinent past surgical history. No family history on file. Social History  Substance Use Topics  . Smoking status: Current Every Day Smoker -- 0.50 packs/day  . Smokeless tobacco: None  . Alcohol Use: Yes     Comment: socially    Review of Systems  Constitutional: Negative for fever.  All other systems reviewed and are negative.     Allergies  Review of patient's allergies indicates no known allergies.  Home Medications   Prior to Admission medications   Medication Sig Start Date End Date Taking? Authorizing Provider  acyclovir (ZOVIRAX) 400 MG tablet Take 1 tablet (400 mg total) by mouth 3 (three) times daily. Patient not taking: Reported on 02/04/2015 09/01/14   [name], PA-C  HYDROcodone-acetaminophen (NORCO/VICODIN) 5-325 MG per tablet Take one-two tabs po q 4-6 hrs prn pain 02/04/15   Tammy Triplett, PA-C  mupirocin ointment (BACTROBAN) 2 % Apply small amt to the affected area TID x 10 days 02/04/15   Tammy Triplett, PA-C   sulfamethoxazole-trimethoprim (BACTRIM DS,SEPTRA DS) 800-160 MG per tablet Take 1 tablet by mouth 2 (two) times daily. 08/03/15 08/10/15  Teressa Lower, NP   BP 141/87 mmHg  Pulse 53  Temp(Src) 98.4 F (36.9 C) (Oral)  Resp 20  Ht  (1.778 m)  Wt 155 lb (70.308 kg)  BMI 22.24 kg/m2  SpO2 100% Physical Exam  Constitutional: He is oriented to person, place, and time. He appears well-developed and well-nourished.  Cardiovascular: Normal rate and regular rhythm.   Pulmonary/Chest: Effort normal and breath sounds normal.  Musculoskeletal: Normal range of motion.  Neurological: He is alert and oriented to person, place, and time.  Skin:  Fluctuant area to the left hip with redness to the area. Some groin lymphadenopathy noted  Nursing note and vitals reviewed.   ED Course  INCISION AND DRAINAGE Date/Time: 08/03/2015 9:59 AM Performed by: Teressa Lower Authorized by: Teressa Lower Risks and benefits: risks, benefits and alternatives were discussed Consent given by: patient Patient identity confirmed: verbally with patient Type: abscess Body area: lower extremity Location details: left hip Anesthesia: local infiltration Local anesthetic: lidocaine 2% without epinephrine Scalpel size: 11 Complexity: simple Drainage amount: scant Wound treatment: wound left open Patient tolerance: Patient tolerated the procedure well with no immediate complications   (including critical care time) Labs Review Labs Reviewed - No data to display  Imaging Review No results found.   EKG Interpretation None      MDM   Final diagnoses:  Abscess  Opened without any problem. Will send home on bactrim. Discussed return precautions with pt   Teressa Lower, NP 08/03/15 1000  Tilden Fossa, MD 08/03/15 1316

## 2016-05-07 ENCOUNTER — Encounter (HOSPITAL_COMMUNITY): Payer: Self-pay

## 2016-05-07 ENCOUNTER — Emergency Department (HOSPITAL_COMMUNITY)
Admission: EM | Admit: 2016-05-07 | Discharge: 2016-05-07 | Disposition: A | Discharge status: Home / Self Care | Payer: BLUE CROSS/BLUE SHIELD | Attending: Emergency Medicine | Admitting: Emergency Medicine

## 2016-05-07 DIAGNOSIS — R21 Rash and other nonspecific skin eruption: Secondary | ICD-10-CM | POA: Insufficient documentation

## 2016-05-07 DIAGNOSIS — F17200 Nicotine dependence, unspecified, uncomplicated: Secondary | ICD-10-CM | POA: Diagnosis not present

## 2016-05-07 DIAGNOSIS — M791 Myalgia: Secondary | ICD-10-CM | POA: Diagnosis not present

## 2016-05-07 DIAGNOSIS — M272 Inflammatory conditions of jaws: Secondary | ICD-10-CM | POA: Diagnosis present

## 2016-05-07 DIAGNOSIS — L738 Other specified follicular disorders: Secondary | ICD-10-CM

## 2016-05-07 DIAGNOSIS — L731 Pseudofolliculitis barbae: Secondary | ICD-10-CM | POA: Insufficient documentation

## 2016-05-07 MED ORDER — HYDROCODONE-ACETAMINOPHEN 5-325 MG PO TABS: 1.0000 | ORAL_TABLET | ORAL | Status: DC | PRN

## 2016-05-07 MED ORDER — DOXYCYCLINE HYCLATE 100 MG PO TABS
100.0000 mg | ORAL_TABLET | Freq: Once | ORAL | Status: AC
  Administered 2016-05-07: 100 mg via ORAL
  Filled 2016-05-07: qty 1

## 2016-05-07 MED ORDER — HYDROCODONE-ACETAMINOPHEN 5-325 MG PO TABS
2.0000 | ORAL_TABLET | Freq: Once | ORAL | Status: AC
  Administered 2016-05-07: 2 via ORAL
  Filled 2016-05-07: qty 2

## 2016-05-07 MED ORDER — IBUPROFEN 800 MG PO TABS
800.0000 mg | ORAL_TABLET | Freq: Once | ORAL | Status: AC
  Administered 2016-05-07: 800 mg via ORAL
  Filled 2016-05-07: qty 1

## 2016-05-07 MED ORDER — DOXYCYCLINE HYCLATE 100 MG PO CAPS: 100.0000 mg | ORAL_CAPSULE | Freq: Two times a day (BID) | ORAL | Status: DC

## 2016-05-07 MED ORDER — ERYTHROMYCIN 2 % EX GEL: Freq: Two times a day (BID) | CUTANEOUS | Status: DC

## 2016-05-07 NOTE — ED Notes (Signed)
Pt has abscess to left chin area which has increased in size since yesterday

## 2016-05-07 NOTE — ED Notes (Signed)
Pt verbalized understanding of no driving and to use caution within 4 hours of taking pain meds due to meds cause drowsiness.  Instructed pt to take all of antibiotics as prescribed. 

## 2016-05-07 NOTE — ED Provider Notes (Signed)
CSN: [id number]     Arrival date & time 05/07/16  1204 History  By signing my name below, I, Scott [name], attest that this documentation has been prepared under the direction and in the presence of Scott Quale, PA-C. Electronically Signed: Placido [name], ED Scribe. 05/07/2016. 2:13 PM.   Chief Complaint  Patient presents with  . Abscess   The history is provided by the patient. No language interpreter was used.    HPI Comments: Scott [name] is a 30 y.o. male who presents to the Emergency Department complaining of worsening, moderate, boil to his left lower jaw x 2 days. He states that it began as an infected hair which he removed without any relief. He reports associated, moderate, pain surrounding the region that worsens with palpation or with jaw movement. He confirms a hx of similar symptoms. Pt denies any known drug allergies. Pt denies any other associated symptoms at this time.   Past Medical History  Diagnosis Date  . Strep throat    History reviewed. No pertinent past surgical history. No family history on file. Social History  Substance Use Topics  . Smoking status: Current Every Day Smoker -- 0.50 packs/day  . Smokeless tobacco: None  . Alcohol Use: Yes     Comment: socially    Review of Systems  Musculoskeletal: Positive for myalgias.  Skin: Positive for rash.  All other systems reviewed and are negative.  Allergies  Review of patient's allergies indicates no known allergies.  Home Medications   Prior to Admission medications   Medication Sig Start Date End Date Taking? Authorizing Provider  acyclovir (ZOVIRAX) 400 MG tablet Take 1 tablet (400 mg total) by mouth 3 (three) times daily. Patient not taking: Reported on 02/04/2015 09/01/14   Scott Quale, PA-C  HYDROcodone-acetaminophen (NORCO/VICODIN) 5-325 MG per tablet Take one-two tabs po q 4-6 hrs prn pain 02/04/15   Tammy Triplett, PA-C  mupirocin ointment (BACTROBAN) 2 % Apply small amt to the affected  area TID x 10 days 02/04/15   Tammy Triplett, PA-C   BP 111/73 mmHg  Pulse 81  Temp(Src) 98.2 F (36.8 C) (Oral)  Resp 16  Ht  (1.778 m)  Wt 155 lb (70.308 kg)  BMI 22.24 kg/m2  SpO2 100%    Physical Exam  Constitutional: He is oriented to person, place, and time. He appears well-developed and well-nourished.  HENT:  Head: Normocephalic and atraumatic.  No temperature changes about the face. Abscess on the left angle of the jaw. No red streaks appreciated.   Neck: Normal range of motion.  Cardiovascular: Normal rate, regular rhythm, normal heart sounds and intact distal pulses.  Exam reveals no gallop and no friction rub.   No murmur heard. Pulmonary/Chest: Effort normal and breath sounds normal. No respiratory distress. He has no wheezes. He has no rales. He exhibits no tenderness.  Abdominal: Soft.  Musculoskeletal: Normal range of motion.  Neurological: He is alert and oriented to person, place, and time.  Skin: Skin is warm and dry.  Psychiatric: He has a normal mood and affect.  Nursing note and vitals reviewed.   ED Course  Procedures  DIAGNOSTIC STUDIES: Oxygen Saturation is 100% on RA, normal by my interpretation.    COORDINATION OF CARE: 2:03 PM Discussed next steps with pt. Return precautions noted. He verbalized understanding and is agreeable with the plan.   Labs Review Labs Reviewed - No data to display  Imaging Review No results found.   EKG Interpretation None  MDM  The examination favors a folliculitis, with possible early abscess at the angle of the left jaw. The patient will be treated with erythromycin gel, as well as doxycycline capsule. The patient is given Norco No. 12 tablets.    Final diagnoses:  None    **I personally performed the services described in this documentation, which was scribed in my presence. The recorded information has been reviewed and is accurate.*  **I have reviewed nursing notes, vital signs, and all  appropriate lab and imaging results for this patient.Scott [name]*   Felica Chargois, PA-C 05/07/16 2000  Geoffery Lyonsouglas Delo, MD 05/08/16 [phone]

## 2016-05-07 NOTE — Discharge Instructions (Signed)
Please use warm compress to the affected area 2 or 3 times daily. Use erythromycin gel to the area 2 times daily. Use doxycycline two times daily. Use ibuprofen for mild pain use norco for more severe pain.

## 2016-05-07 NOTE — ED Notes (Signed)
Pt with infected ingrown hair to left jaw, pt states that he removed the hair but swelling increased since, states it hurts to eat due to location

## 2016-07-30 ENCOUNTER — Emergency Department (HOSPITAL_COMMUNITY)
Admission: EM | Admit: 2016-07-30 | Discharge: 2016-07-30 | Disposition: A | Discharge status: Home / Self Care | Payer: Worker's Compensation | Attending: Emergency Medicine | Admitting: Emergency Medicine

## 2016-07-30 ENCOUNTER — Emergency Department (HOSPITAL_COMMUNITY): Payer: Worker's Compensation

## 2016-07-30 ENCOUNTER — Encounter (HOSPITAL_COMMUNITY): Payer: Self-pay | Admitting: Emergency Medicine

## 2016-07-30 DIAGNOSIS — W208XXA Other cause of strike by thrown, projected or falling object, initial encounter: Secondary | ICD-10-CM | POA: Diagnosis not present

## 2016-07-30 DIAGNOSIS — Y929 Unspecified place or not applicable: Secondary | ICD-10-CM | POA: Diagnosis not present

## 2016-07-30 DIAGNOSIS — S40011A Contusion of right shoulder, initial encounter: Secondary | ICD-10-CM

## 2016-07-30 DIAGNOSIS — S4991XA Unspecified injury of right shoulder and upper arm, initial encounter: Secondary | ICD-10-CM | POA: Diagnosis present

## 2016-07-30 DIAGNOSIS — F17200 Nicotine dependence, unspecified, uncomplicated: Secondary | ICD-10-CM | POA: Diagnosis not present

## 2016-07-30 DIAGNOSIS — Y990 Civilian activity done for income or pay: Secondary | ICD-10-CM | POA: Insufficient documentation

## 2016-07-30 DIAGNOSIS — Y939 Activity, unspecified: Secondary | ICD-10-CM | POA: Diagnosis not present

## 2016-07-30 MED ORDER — IBUPROFEN 600 MG PO TABS: 600.0000 mg | ORAL_TABLET | Freq: Four times a day (QID) | ORAL | 0 refills | Status: DC | PRN

## 2016-07-30 NOTE — ED Notes (Signed)
Patient returned from X-ray 

## 2016-07-30 NOTE — ED Triage Notes (Signed)
Pt states he was working and a cloth roll hit is rt shoulder.

## 2016-07-30 NOTE — ED Notes (Signed)
Patient transported to X-ray 

## 2016-08-01 NOTE — ED Provider Notes (Signed)
[facility]-EMER[facility]ENCY DEPT Provider Note   CSN: [id number] Arrival date & time: 07/30/16  2016  First Provider Contact:  None       History   Chief Complaint Chief Complaint  Patient presents with  . Shoulder Pain    HPI Scott [name] is a 30 y.o. male.  Scott [name] is a 30 y.o. Male presenting for evaluation of right upper back/scapular injury.  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  He reports he would have been crushed by the object except it was stopped from falling further by another piece of the machine.  He reports constant nonradiating pain in the scapula. He denies neck or head pain or injury and has no sob or pain with deep inspiration.  Pain is worsened with right arm movement.  He has had no treatments prior to arrival.      The history is provided by the patient.    Past Medical History:  Diagnosis Date  . Strep throat     There are no active problems to display for this patient.   History reviewed. No pertinent surgical history.     Home Medications    Prior to Admission medications   Medication Sig Start Date End Date Taking? Authorizing Provider  ibuprofen (ADVIL,MOTRIN) 600 MG tablet Take 1 tablet (600 mg total) by mouth every 6 (six) hours as needed. 07/30/16   Burgess AmorJulie Keyshla Tunison, PA-C    Family History No family history on file.  Social History Social History  Substance Use Topics  . Smoking status: Current Every Day Smoker    Packs/day: 0.50  . Smokeless tobacco: Never Used  . Alcohol use Yes     Comment: socially     Allergies   Review of patient's allergies indicates no known allergies.   Review of Systems Review of Systems  Constitutional: Negative for fever.  Musculoskeletal: Positive for arthralgias. Negative for joint swelling and myalgias.  Neurological: Negative for weakness and  numbness.     Physical Exam Updated Vital Signs BP 100/83   Pulse 84   Temp 98.5 F (36.9 C)   Resp 14   Ht 5\' 10"  (1.778 m)   Wt 69.4 kg   SpO2 99%   BMI 21.95 kg/m   Physical Exam  Constitutional: He appears well-developed and well-nourished.  HENT:  Head: Atraumatic.  Neck: Normal range of motion.  Cardiovascular:  Pulses equal bilaterally  Musculoskeletal: He exhibits tenderness. He exhibits no edema or deformity.       Cervical back: Normal. He exhibits no bony tenderness.       Thoracic back: Normal. He exhibits no tenderness.       Arms: ttp right upper scapula with faint abrasion noted.  No bruising, edema, no hematoma.  No palpable deformity.  Pt with increased pain with right shoulder movement. The right shoulder is nontender to palpation.  Neurological: He is alert. He has normal strength. He displays normal reflexes. No sensory deficit.  Skin: Skin is warm and dry.  Psychiatric: He has a normal mood and affect.     ED Treatments / Results  Labs (all labs ordered are listed, but only abnormal results are displayed) Labs Reviewed - No data to display  EKG  EKG Interpretation None       Radiology Dg Scapula Right  Result Date: 07/30/2016 CLINICAL DATA:  Right scapular pain after direct blow to the scapula. Patient states he was at work tonight around 20:00 hrs when a roll of fabric caught on a piece of machinery, the machine then spun around and hit the patient in the right scapula. Patient reports pain in the right scapula at this time that increases with movement. EXAM: RIGHT SCAPULA - 2+ VIEWS COMPARISON:  None. FINDINGS: There is no evidence of fracture or other focal bone lesions. Shoulder alignment is maintained. Soft tissues are unremarkable. IMPRESSION: Negative radiographs of the scapula. Electronically Signed   By: Rubye Oaks M.D.   On: 07/30/2016 21:47    Procedures Procedures (including critical care time)  Medications Ordered in  ED Medications - No data to display   Initial Impression / Assessment and Plan / ED Course  I have reviewed the triage vital signs and the nursing notes.  Pertinent labs & imaging results that were available during my care of the patient were reviewed by me and considered in my medical decision making (see chart for details).  Clinical Course    Images reviewed and discussed with patient.  Advised ice tx,  Ibuprofen prescribed. Prn f/u with employers occupational medical provider prn if sx persist.  Final Clinical Impressions(s) / ED Diagnoses   Final diagnoses:  Contusion of right scapular region, initial encounter    New Prescriptions Discharge Medication List as of 07/30/2016  9:56 PM    START taking these medications   Details  ibuprofen (ADVIL,MOTRIN) 600 MG tablet Take 1 tablet (600 mg total) by mouth every 6 (six) hours as needed., Starting Mon 07/30/2016, Print         Burgess Amor, PA-C 08/01/16 1258    Zadie Rhine, MD 08/01/16 2303

## 2016-08-19 ENCOUNTER — Encounter (HOSPITAL_COMMUNITY): Payer: Self-pay | Admitting: Emergency Medicine

## 2016-08-19 ENCOUNTER — Emergency Department (HOSPITAL_COMMUNITY)
Admission: EM | Admit: 2016-08-19 | Discharge: 2016-08-19 | Disposition: A | Discharge status: Home / Self Care | Payer: BLUE CROSS/BLUE SHIELD | Attending: Emergency Medicine | Admitting: Emergency Medicine

## 2016-08-19 DIAGNOSIS — K625 Hemorrhage of anus and rectum: Secondary | ICD-10-CM

## 2016-08-19 DIAGNOSIS — R369 Urethral discharge, unspecified: Secondary | ICD-10-CM | POA: Insufficient documentation

## 2016-08-19 DIAGNOSIS — M545 Low back pain, unspecified: Secondary | ICD-10-CM

## 2016-08-19 DIAGNOSIS — F17200 Nicotine dependence, unspecified, uncomplicated: Secondary | ICD-10-CM | POA: Insufficient documentation

## 2016-08-19 LAB — CBC WITH DIFFERENTIAL/PLATELET
Basophils Absolute: 0 10*3/uL (ref 0.0–0.1)
Basophils Relative: 0 %
EOS PCT: 2 %
Eosinophils Absolute: 0.1 10*3/uL (ref 0.0–0.7)
HEMATOCRIT: 39.7 % (ref 39.0–52.0)
Hemoglobin: 13.8 g/dL (ref 13.0–17.0)
LYMPHS PCT: 25 %
Lymphs Abs: 1.2 10*3/uL (ref 0.7–4.0)
MCH: 30.4 pg (ref 26.0–34.0)
MCHC: 34.8 g/dL (ref 30.0–36.0)
MCV: 87.4 fL (ref 78.0–100.0)
Monocytes Absolute: 0.5 10*3/uL (ref 0.1–1.0)
Monocytes Relative: 12 %
NEUTROS ABS: 2.8 10*3/uL (ref 1.7–7.7)
Neutrophils Relative %: 61 %
Platelets: 247 10*3/uL (ref 150–400)
RBC: 4.54 MIL/uL (ref 4.22–5.81)
RDW: 12.7 % (ref 11.5–15.5)
WBC: 4.6 10*3/uL (ref 4.0–10.5)

## 2016-08-19 LAB — BASIC METABOLIC PANEL
Anion gap: 6 (ref 5–15)
BUN: 17 mg/dL (ref 6–20)
CALCIUM: 9.2 mg/dL (ref 8.9–10.3)
CHLORIDE: 106 mmol/L (ref 101–111)
CO2: 27 mmol/L (ref 22–32)
CREATININE: 0.88 mg/dL (ref 0.61–1.24)
Glucose, Bld: 83 mg/dL (ref 65–99)
Potassium: 4.1 mmol/L (ref 3.5–5.1)
SODIUM: 139 mmol/L (ref 135–145)

## 2016-08-19 LAB — URINALYSIS, ROUTINE W REFLEX MICROSCOPIC
Bilirubin Urine: NEGATIVE
GLUCOSE, UA: NEGATIVE mg/dL
Hgb urine dipstick: NEGATIVE
Ketones, ur: NEGATIVE mg/dL
Leukocytes, UA: NEGATIVE
Nitrite: NEGATIVE
Protein, ur: NEGATIVE mg/dL
SPECIFIC GRAVITY, URINE: 1.02 (ref 1.005–1.030)
pH: 7 (ref 5.0–8.0)

## 2016-08-19 LAB — RAPID HIV SCREEN (HIV 1/2 AB+AG)
HIV 1/2 ANTIBODIES: NONREACTIVE
HIV-1 P24 ANTIGEN - HIV24: NONREACTIVE

## 2016-08-19 LAB — POC OCCULT BLOOD, ED: Fecal Occult Bld: NEGATIVE

## 2016-08-19 MED ORDER — LIDOCAINE HCL (PF) 1 % IJ SOLN
INTRAMUSCULAR | Status: AC
  Filled 2016-08-19: qty 5

## 2016-08-19 MED ORDER — AZITHROMYCIN 250 MG PO TABS
1000.0000 mg | ORAL_TABLET | Freq: Once | ORAL | Status: AC
  Administered 2016-08-19: 1000 mg via ORAL
  Filled 2016-08-19: qty 4

## 2016-08-19 MED ORDER — CEFTRIAXONE SODIUM 250 MG IJ SOLR
250.0000 mg | Freq: Once | INTRAMUSCULAR | Status: AC
  Administered 2016-08-19: 250 mg via INTRAMUSCULAR
  Filled 2016-08-19: qty 250

## 2016-08-19 MED ORDER — IBUPROFEN 400 MG PO TABS
600.0000 mg | ORAL_TABLET | Freq: Once | ORAL | Status: AC
  Administered 2016-08-19: 600 mg via ORAL
  Filled 2016-08-19: qty 2

## 2016-08-19 MED ORDER — DOCUSATE SODIUM 100 MG PO CAPS: 100.0000 mg | ORAL_CAPSULE | Freq: Two times a day (BID) | ORAL | 0 refills | Status: DC

## 2016-08-19 NOTE — ED Triage Notes (Signed)
Pt comes in for STD check but also reports blood in his stool today and low back pain.

## 2016-08-19 NOTE — Discharge Instructions (Signed)
You are likely bleeding from internal hemorrhoids. Take stool softeners as prescribed.   You were treated for STD. You will be called if the rest of the STD testing is positive.  Continue to take ibuprofen and tylenol for pain control.   Return without fail for worsening symptoms, including worsening bleeding, black stools, passing out, fever, worsening pain, inability to walk, or any other symptoms concerning to you.
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
## 2016-08-20 LAB — URINE CULTURE: CULTURE: NO GROWTH

## 2016-08-20 LAB — GC/CHLAMYDIA PROBE AMP (~~LOC~~) NOT AT ARMC
Chlamydia: POSITIVE — AB
Neisseria Gonorrhea: NEGATIVE

## 2016-08-20 LAB — RPR: RPR Ser Ql: NONREACTIVE

## 2016-08-21 ENCOUNTER — Telehealth (HOSPITAL_BASED_OUTPATIENT_CLINIC_OR_DEPARTMENT_OTHER): Payer: Self-pay | Admitting: Emergency Medicine

## 2016-10-16 ENCOUNTER — Telehealth (HOSPITAL_BASED_OUTPATIENT_CLINIC_OR_DEPARTMENT_OTHER): Payer: Self-pay | Admitting: Emergency Medicine

## 2016-10-16 NOTE — Telephone Encounter (Signed)
Lost to followup 

## 2017-03-29 ENCOUNTER — Emergency Department (HOSPITAL_COMMUNITY)
Admission: EM | Admit: 2017-03-29 | Discharge: 2017-03-29 | Disposition: A | Discharge status: Home / Self Care | Payer: BLUE CROSS/BLUE SHIELD | Attending: Emergency Medicine | Admitting: Emergency Medicine

## 2017-03-29 ENCOUNTER — Encounter (HOSPITAL_COMMUNITY): Payer: Self-pay | Admitting: *Deleted

## 2017-03-29 ENCOUNTER — Emergency Department (HOSPITAL_COMMUNITY): Payer: BLUE CROSS/BLUE SHIELD

## 2017-03-29 DIAGNOSIS — Y929 Unspecified place or not applicable: Secondary | ICD-10-CM | POA: Diagnosis not present

## 2017-03-29 DIAGNOSIS — Y999 Unspecified external cause status: Secondary | ICD-10-CM | POA: Insufficient documentation

## 2017-03-29 DIAGNOSIS — F17200 Nicotine dependence, unspecified, uncomplicated: Secondary | ICD-10-CM | POA: Diagnosis not present

## 2017-03-29 DIAGNOSIS — W25XXXA Contact with sharp glass, initial encounter: Secondary | ICD-10-CM | POA: Diagnosis not present

## 2017-03-29 DIAGNOSIS — S51811A Laceration without foreign body of right forearm, initial encounter: Secondary | ICD-10-CM

## 2017-03-29 DIAGNOSIS — Z23 Encounter for immunization: Secondary | ICD-10-CM | POA: Diagnosis not present

## 2017-03-29 DIAGNOSIS — Y939 Activity, unspecified: Secondary | ICD-10-CM | POA: Insufficient documentation

## 2017-03-29 MED ORDER — LIDOCAINE HCL (PF) 1 % IJ SOLN
5.0000 mL | Freq: Once | INTRAMUSCULAR | Status: AC
  Administered 2017-03-29: 5 mL
  Filled 2017-03-29: qty 5

## 2017-03-29 MED ORDER — TETANUS-DIPHTH-ACELL PERTUSSIS 5-2.5-18.5 LF-MCG/0.5 IM SUSP
0.5000 mL | Freq: Once | INTRAMUSCULAR | Status: AC
  Administered 2017-03-29: 0.5 mL via INTRAMUSCULAR
  Filled 2017-03-29: qty 0.5
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
## 2017-04-08 ENCOUNTER — Emergency Department (HOSPITAL_COMMUNITY)
Admission: EM | Admit: 2017-04-08 | Discharge: 2017-04-08 | Disposition: A | Discharge status: Home / Self Care | Payer: BLUE CROSS/BLUE SHIELD | Attending: Emergency Medicine | Admitting: Emergency Medicine

## 2017-04-08 DIAGNOSIS — Y999 Unspecified external cause status: Secondary | ICD-10-CM | POA: Insufficient documentation

## 2017-04-08 DIAGNOSIS — Z4802 Encounter for removal of sutures: Secondary | ICD-10-CM

## 2017-04-08 DIAGNOSIS — F17200 Nicotine dependence, unspecified, uncomplicated: Secondary | ICD-10-CM | POA: Diagnosis not present

## 2017-04-08 DIAGNOSIS — X371XXA Tornado, initial encounter: Secondary | ICD-10-CM | POA: Diagnosis not present

## 2017-04-08 DIAGNOSIS — Y929 Unspecified place or not applicable: Secondary | ICD-10-CM | POA: Diagnosis not present

## 2017-04-08 DIAGNOSIS — S4992XA Unspecified injury of left shoulder and upper arm, initial encounter: Secondary | ICD-10-CM | POA: Diagnosis present

## 2017-04-08 DIAGNOSIS — Y939 Activity, unspecified: Secondary | ICD-10-CM | POA: Diagnosis not present

## 2017-04-08 DIAGNOSIS — S40812A Abrasion of left upper arm, initial encounter: Secondary | ICD-10-CM | POA: Diagnosis not present

## 2017-04-08 MED ORDER — TRAMADOL HCL 50 MG PO TABS
100.0000 mg | ORAL_TABLET | Freq: Once | ORAL | Status: AC
  Administered 2017-04-08: 100 mg via ORAL
  Filled 2017-04-08: qty 2

## 2017-04-08 MED ORDER — IBUPROFEN 800 MG PO TABS
800.0000 mg | ORAL_TABLET | Freq: Once | ORAL | Status: AC
  Administered 2017-04-08: 800 mg via ORAL
  Filled 2017-04-08: qty 1

## 2017-04-08 MED ORDER — TRAMADOL HCL 50 MG PO TABS: 50.0000 mg | ORAL_TABLET | Freq: Four times a day (QID) | ORAL | 0 refills | Status: DC | PRN

## 2017-04-08 MED ORDER — IBUPROFEN 600 MG PO TABS
600.0000 mg | ORAL_TABLET | Freq: Four times a day (QID) | ORAL | 0 refills | Status: DC
Start: 2017-04-08 — End: 2017-08-18

## 2017-04-08 MED ORDER — ONDANSETRON HCL 4 MG PO TABS
4.0000 mg | ORAL_TABLET | Freq: Once | ORAL | Status: AC
  Administered 2017-04-08: 4 mg via ORAL
  Filled 2017-04-08: qty 1

## 2017-04-08 MED ORDER — BACITRACIN-NEOMYCIN-POLYMYXIN 400-5-5000 EX OINT
TOPICAL_OINTMENT | Freq: Once | CUTANEOUS | Status: AC
  Administered 2017-04-08: 1 via TOPICAL
  Filled 2017-04-08: qty 1

## 2017-04-08 NOTE — Discharge Instructions (Signed)
Please cleanse the wounds to your left arm daily. Please apply Neosporin 2 times daily until healed. Use 600 mg of ibuprofen with breakfast, lunch, dinner, and bedtime. May use Ultram for more severe pain.
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
## 2017-04-08 NOTE — ED Notes (Signed)
Removed three stitches from right forearm. Patient tolerated well.

## 2017-04-08 NOTE — ED Triage Notes (Signed)
Pt here for suture removal from right forearm.  Also needs abrasion looked at on left arm

## 2017-05-09 ENCOUNTER — Ambulatory Visit: Payer: BLUE CROSS/BLUE SHIELD | Admitting: Family Medicine

## 2017-08-18 ENCOUNTER — Emergency Department (HOSPITAL_COMMUNITY)
Admission: EM | Admit: 2017-08-18 | Discharge: 2017-08-18 | Disposition: A | Discharge status: Home / Self Care | Payer: BLUE CROSS/BLUE SHIELD | Attending: Emergency Medicine | Admitting: Emergency Medicine

## 2017-08-18 ENCOUNTER — Encounter (HOSPITAL_COMMUNITY): Payer: Self-pay | Admitting: Emergency Medicine

## 2017-08-18 DIAGNOSIS — R1084 Generalized abdominal pain: Secondary | ICD-10-CM | POA: Diagnosis present

## 2017-08-18 DIAGNOSIS — R109 Unspecified abdominal pain: Secondary | ICD-10-CM

## 2017-08-18 DIAGNOSIS — F17210 Nicotine dependence, cigarettes, uncomplicated: Secondary | ICD-10-CM | POA: Diagnosis not present

## 2017-08-18 LAB — URINALYSIS, ROUTINE W REFLEX MICROSCOPIC
Bilirubin Urine: NEGATIVE
Glucose, UA: NEGATIVE mg/dL
HGB URINE DIPSTICK: NEGATIVE
Ketones, ur: NEGATIVE mg/dL
Leukocytes, UA: NEGATIVE
NITRITE: NEGATIVE
PROTEIN: NEGATIVE mg/dL
SPECIFIC GRAVITY, URINE: 1.023 (ref 1.005–1.030)
pH: 6 (ref 5.0–8.0)

## 2017-08-18 MED ORDER — IBUPROFEN 800 MG PO TABS
800.0000 mg | ORAL_TABLET | Freq: Once | ORAL | Status: AC
  Administered 2017-08-18: 800 mg via ORAL
  Filled 2017-08-18: qty 1

## 2017-08-18 MED ORDER — IBUPROFEN 600 MG PO TABS: 600.0000 mg | ORAL_TABLET | Freq: Three times a day (TID) | ORAL | 0 refills | Status: DC | PRN

## 2017-08-18 NOTE — Discharge Instructions (Signed)

## 2017-08-18 NOTE — ED Triage Notes (Signed)
Patient has right flank pain for last several months mostly in am, pain increases with movement and turning.

## 2017-08-18 NOTE — ED Provider Notes (Signed)
[facility] DEPT Provider Note   CSN: [id number] Arrival date & time: 08/18/17  0234     History   Chief Complaint Chief Complaint  Patient presents with  . Flank Pain    HPI Scott [name] is a 31 y.o. male.  The history is provided by the patient.  Flank Pain  This is a chronic problem. The current episode started more than 1 week ago. The problem occurs daily. The problem has been gradually worsening. Pertinent negatives include no chest pain, no abdominal pain and no shortness of breath. The symptoms are aggravated by bending. The symptoms are relieved by rest.  pt reports right flank/back pain for past several months No injury No fever/vomiting No cp/sob No urinary symptoms No weakness He does frequent bending and heavy lifting at work, this seems to aggravate symptoms  Past Medical History:  Diagnosis Date  . Strep throat     There are no active problems to display for this patient.   History reviewed. No pertinent surgical history.     Home Medications    Prior to Admission medications   Medication Sig Start Date End Date Taking? Authorizing Provider  ibuprofen (ADVIL,MOTRIN) 600 MG tablet Take 1 tablet (600 mg total) by mouth every 8 (eight) hours as needed for moderate pain. 08/18/17   Zadie Rhine, MD    Family History History reviewed. No pertinent family history.  Social History Social History  Substance Use Topics  . Smoking status: Current Every Day Smoker    Packs/day: 0.50  . Smokeless tobacco: Never Used  . Alcohol use Yes     Comment: socially     Allergies   Patient has no known allergies.   Review of Systems Review of Systems  Constitutional: Negative for fever.  Respiratory: Negative for shortness of breath.        Denies hemoptysis   Cardiovascular: Negative for chest pain.  Gastrointestinal: Negative for abdominal pain.  Genitourinary: Positive for flank pain.  Neurological: Negative for weakness.  All other  systems reviewed and are negative.    Physical Exam Updated Vital Signs BP 119/80 (BP Location: Left Arm)   Pulse 77   Temp 98.2 F (36.8 C) (Oral)   Resp 18   Ht 1.778 m (5\' 10" )   Wt 71.2 kg (157 lb)   SpO2 99%   BMI 22.53 kg/m   Physical Exam CONSTITUTIONAL: Well developed/well nourished HEAD: Normocephalic/atraumatic EYES: EOMI/PERRL ENMT: Mucous membranes moist NECK: supple no meningeal signs SPINE/BACK:entire spine nontender, mild tenderness to thoracic paraspinal region and also immediately below ribs, no crepitus/bruising CV: S1/S2 noted, no murmurs/rubs/gallops noted LUNGS: Lungs are clear to auscultation bilaterally, no apparent distress ABDOMEN: soft, nontender, no rebound or guarding GU:no cva tenderness NEURO: Pt is awake/alert/appropriate, moves all extremitiesx4.  No facial droop.  No focal weakness to extremities EXTREMITIES: pulses normal/equal, full ROM SKIN: warm, color normal PSYCH: no abnormalities of mood noted, alert and oriented to situation   ED Treatments / Results  Labs (all labs ordered are listed, but only abnormal results are displayed) Labs Reviewed  URINALYSIS, ROUTINE W REFLEX MICROSCOPIC    EKG  EKG Interpretation None       Radiology No results found.  Procedures Procedures (including critical care time)  Medications Ordered in ED Medications  ibuprofen (ADVIL,MOTRIN) tablet 800 mg (not administered)     Initial Impression / Assessment and Plan / ED Course  I have reviewed the triage vital signs and the nursing notes.  Pertinent labs  results that were available during my care of the patient were reviewed by me and considered in my medical decision making (see chart for details).     Pt sleeping on arrival to room, no distress He is ambulatory Pain worse with movement/palpation Likely MSK in nature Advised heating pad, IBuprofen and modify work behavior We discussed return precautions   Final Clinical  Impressions(s) / ED Diagnoses   Final diagnoses:  Flank pain    New Prescriptions New Prescriptions   IBUPROFEN (ADVIL,MOTRIN) 600 MG TABLET    Take 1 tablet (600 mg total) by mouth every 8 (eight) hours as needed for moderate pain.     Zadie Rhine, MD 08/18/17 0430
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
## 2018-05-04 ENCOUNTER — Other Ambulatory Visit: Payer: Self-pay

## 2018-05-04 ENCOUNTER — Emergency Department (HOSPITAL_COMMUNITY)
Admission: EM | Admit: 2018-05-04 | Discharge: 2018-05-04 | Disposition: A | Discharge status: Home / Self Care | Payer: BLUE CROSS/BLUE SHIELD | Attending: Emergency Medicine | Admitting: Emergency Medicine

## 2018-05-04 ENCOUNTER — Encounter (HOSPITAL_COMMUNITY): Payer: Self-pay | Admitting: Emergency Medicine

## 2018-05-04 DIAGNOSIS — B353 Tinea pedis: Secondary | ICD-10-CM | POA: Insufficient documentation

## 2018-05-04 DIAGNOSIS — F17200 Nicotine dependence, unspecified, uncomplicated: Secondary | ICD-10-CM | POA: Insufficient documentation

## 2018-05-04 DIAGNOSIS — L089 Local infection of the skin and subcutaneous tissue, unspecified: Secondary | ICD-10-CM

## 2018-05-04 MED ORDER — CEPHALEXIN 500 MG PO CAPS
500.0000 mg | ORAL_CAPSULE | Freq: Once | ORAL | Status: AC
  Administered 2018-05-04: 500 mg via ORAL
  Filled 2018-05-04: qty 1

## 2018-05-04 MED ORDER — CEFDINIR 300 MG PO CAPS: 300.0000 mg | ORAL_CAPSULE | Freq: Two times a day (BID) | ORAL | 0 refills | Status: DC

## 2018-05-04 MED ORDER — FLUCONAZOLE 100 MG PO TABS
200.0000 mg | ORAL_TABLET | Freq: Once | ORAL | Status: AC
  Administered 2018-05-04: 200 mg via ORAL
  Filled 2018-05-04: qty 2

## 2018-05-04 MED ORDER — ONDANSETRON HCL 4 MG PO TABS
4.0000 mg | ORAL_TABLET | Freq: Once | ORAL | Status: AC
  Administered 2018-05-04: 4 mg via ORAL
  Filled 2018-05-04: qty 1

## 2018-05-04 MED ORDER — IBUPROFEN 800 MG PO TABS
800.0000 mg | ORAL_TABLET | Freq: Once | ORAL | Status: AC
  Administered 2018-05-04: 800 mg via ORAL
  Filled 2018-05-04: qty 1

## 2018-05-04 NOTE — ED Provider Notes (Signed)
[facility] EMERGENCY DEPARTMENT Provider Note   CSN: [id number] Arrival date & time: 05/04/18  1043     History   Chief Complaint Chief Complaint  Patient presents with  . Rash    HPI Scott [name] is a 32 y.o. male.  Patient is a 32 year old male who presents to the emergency department with rash and pain concerning his feet.  The patient states he has been dealing with what he thought was athlete's foot over the last week to week and a half.  He says he now has lesions between his toes.  He has one red raised area over the left fifth and fourth toe area.  There is some increased redness, but no red streaks according to the patient.  He states that he developed a pimple there and he squeezed it and got some material out of it.  After this he is noted increased swelling and tenderness of his feet.  He says at times the pain is almost unbearable when he is attempting to walk.  Over the last week, he has been using a clean white socks, using over-the-counter athlete's foot cream, and allowing his feet to breathe once he gets in from his work and daily activities, but states that this is still not seeming to help.  He presents now for assistance with this issue.  The history is provided by the patient.    Past Medical History:  Diagnosis Date  . Strep throat     There are no active problems to display for this patient.   History reviewed. No pertinent surgical history.      Home Medications    Prior to Admission medications   Medication Sig Start Date End Date Taking? Authorizing Provider  ibuprofen (ADVIL,MOTRIN) 600 MG tablet Take 1 tablet (600 mg total) by mouth every 8 (eight) hours as needed for moderate pain. 08/18/17   Zadie Rhine, MD    Family History History reviewed. No pertinent family history.  Social History Social History   Tobacco Use  . Smoking status: Current Every Day Smoker    Packs/day: 0.50  . Smokeless tobacco: Never Used  Substance  Use Topics  . Alcohol use: Yes    Comment: socially  . Drug use: No     Allergies   Patient has no known allergies.   Review of Systems Review of Systems  Constitutional: Negative for activity change and fever.       All ROS Neg except as noted in HPI  HENT: Negative for nosebleeds.   Eyes: Negative for photophobia and discharge.  Respiratory: Negative for cough, shortness of breath and wheezing.   Cardiovascular: Negative for chest pain and palpitations.  Gastrointestinal: Negative for abdominal pain and blood in stool.  Genitourinary: Negative for dysuria, frequency and hematuria.  Musculoskeletal: Negative for arthralgias, back pain and neck pain.       Foot pain  Skin: Negative.   Neurological: Negative for dizziness, seizures and speech difficulty.  Psychiatric/Behavioral: Negative for confusion and hallucinations.     Physical Exam Updated Vital Signs BP 121/90 (BP Location: Right Arm)   Pulse 82   Temp 97.7 F (36.5 C) (Oral)   Resp 18   SpO2 100%   Physical Exam  Constitutional: He is oriented to person, place, and time. He appears well-developed and well-nourished.  Non-toxic appearance.  HENT:  Head: Normocephalic.  Right Ear: Tympanic membrane and external ear normal.  Left Ear: Tympanic membrane and external ear normal.  Eyes: Pupils  are equal, round, and reactive to light. EOM and lids are normal.  Neck: Normal range of motion. Neck supple. Carotid bruit is not present.  Cardiovascular: Normal rate, regular rhythm, normal heart sounds, intact distal pulses and normal pulses.  Pulmonary/Chest: Breath sounds normal. No respiratory distress.  Abdominal: Soft. Bowel sounds are normal. There is no tenderness. There is no guarding.  Musculoskeletal: Normal range of motion.  There is some open lesions in between the toes of the right and left feet.  There is some swelling of the dorsum of the right and left feet.  No red streaks appreciated.  There is some mild  increased warmth present.  The dorsalis pedis and posterior tibial pulses are 2+.  Capillary refill is less than 2 seconds.  Lymphadenopathy:       Head (right side): No submandibular adenopathy present.       Head (left side): No submandibular adenopathy present.    He has no cervical adenopathy.  Neurological: He is alert and oriented to person, place, and time. He has normal strength. No cranial nerve deficit or sensory deficit.  Skin: Skin is warm and dry.  Psychiatric: He has a normal mood and affect. His speech is normal.  Nursing note and vitals reviewed.    ED Treatments / Results  Labs (all labs ordered are listed, but only abnormal results are displayed) Labs Reviewed - No data to display  EKG None  Radiology No results found.  Procedures Procedures (including critical care time)  Medications Ordered in ED Medications - No data to display   Initial Impression / Assessment and Plan / ED Course  I have reviewed the triage vital signs and the nursing notes.  Pertinent labs & imaging results that were available during my care of the patient were reviewed by me and considered in my medical decision making (see chart for details).      Final Clinical Impressions(s) / ED Diagnoses MDM  Vital signs are within normal limits.  Pulse oximetry is 100% on room air.  Within normal limits by my interpretation.  The examination favors athletes foot with secondary skin infection.  The patient is asked to cleanse his feet thoroughly and dry them thoroughly.  He will use Lamisil or other athlete's foot cream and clean white socks until this is resolved.  The patient will be treated with Omnicef 2 times daily for the secondary skin infection.  He will use 600 mg of ibuprofen with each meal for inflammation and for discomfort.  I have asked patient to see his primary physician or return to the emergency department if any fever, red streaking, signs of advancing infection, or changes in  his condition.  Patient is in agreement with this plan.   Final diagnoses:  Skin infection  Tinea pedis of both feet    ED Discharge Orders        Ordered    cefdinir (OMNICEF) 300 MG capsule  2 times daily     05/04/18 1132       [name], PA-C 05/04/18 1138    Eber Hong, MD 05/13/18 1154

## 2018-05-04 NOTE — Discharge Instructions (Addendum)
Your exam shows athlete's foot with a secondary skin infection.  Please clean your feet daily with soap and water, dry your feet thoroughly.  Continue to use the Lamisil or other athlete's foot cream.  Continue to use clean white socks until this is healed.  Use Omnicef 2 times daily with food for the secondary infection.  Use 600 mg of ibuprofen with each meal for inflammation and discomfort.  Please see your primary physician for additional evaluation if not improving.

## 2018-05-04 NOTE — ED Triage Notes (Signed)
Rash around toes pt states has been treating for athletes feet. Pt now states left foot swelling some. Ambulatory

## 2018-06-11 ENCOUNTER — Emergency Department (HOSPITAL_COMMUNITY)
Admission: EM | Admit: 2018-06-11 | Discharge: 2018-06-11 | Disposition: A | Discharge status: Home / Self Care | Payer: Self-pay | Attending: Emergency Medicine | Admitting: Emergency Medicine

## 2018-06-11 ENCOUNTER — Emergency Department (HOSPITAL_COMMUNITY): Payer: Self-pay

## 2018-06-11 ENCOUNTER — Other Ambulatory Visit: Payer: Self-pay

## 2018-06-11 ENCOUNTER — Encounter (HOSPITAL_COMMUNITY): Payer: Self-pay | Admitting: Emergency Medicine

## 2018-06-11 DIAGNOSIS — B353 Tinea pedis: Secondary | ICD-10-CM | POA: Insufficient documentation

## 2018-06-11 DIAGNOSIS — F17210 Nicotine dependence, cigarettes, uncomplicated: Secondary | ICD-10-CM | POA: Insufficient documentation

## 2018-06-11 MED ORDER — CEPHALEXIN 500 MG PO CAPS: 500.0000 mg | ORAL_CAPSULE | Freq: Four times a day (QID) | ORAL | 0 refills | Status: DC

## 2018-06-11 MED ORDER — TERBINAFINE HCL 1 % EX CREA: 1.0000 "application " | TOPICAL_CREAM | Freq: Two times a day (BID) | CUTANEOUS | 0 refills | Status: DC

## 2018-06-11 NOTE — Discharge Instructions (Addendum)
You were seen today for concerns for left toe pain.  It looks like you continue to have issues with athlete's foot.  Switch to Lamisil.  Apply twice daily.  You will be given a short course of antibiotics for any possible superimposed infection.

## 2018-06-11 NOTE — ED Triage Notes (Signed)
Pt states his left second toe has been hurting since yesterday afternoon. Pt states he has had an infection on the left foot in the past along with athlete's foot. Pt does not have an open wound to the area, does have swelling slight swelling of the second and third toe.

## 2018-06-11 NOTE — ED Provider Notes (Signed)
[facility] EMERGENCY DEPARTMENT Provider Note   CSN: [id number] Arrival date & time: 06/11/18  0155     History   Chief Complaint Chief Complaint  Patient presents with  . Toe Pain    HPI Scott [name] is a 32 y.o. male.  HPI  This is a 32 year old male who presents with left second toe pain.  Patient reports increasing pain of the left second toe since yesterday.  Pain is worse with ambulation.  He has a history of athlete's foot.  He continues to use Lotrimin cream.  He has not noted any swelling.  He has not taken anything for the pain.  He denies injury or any new or ill fitting shoes.  Currently he rates his pain at 3 out of 10.  Chart reviewed.  Patient was seen and evaluated in May.  At that time he was noted to have athlete's foot with a superimposed infection.  Patient does report that he improved with antibiotics at that time.  Past Medical History:  Diagnosis Date  . Strep throat     There are no active problems to display for this patient.   History reviewed. No pertinent surgical history.      Home Medications    Prior to Admission medications   Medication Sig Start Date End Date Taking? Authorizing Provider  cefdinir (OMNICEF) 300 MG capsule Take 1 capsule (300 mg total) by mouth 2 (two) times daily. 05/04/18   [name], PA-C  cephALEXin (KEFLEX) 500 MG capsule Take 1 capsule (500 mg total) by mouth 4 (four) times daily. 06/11/18   Sevana Grandinetti, Mayer Masker, MD  ibuprofen (ADVIL,MOTRIN) 600 MG tablet Take 1 tablet (600 mg total) by mouth every 8 (eight) hours as needed for moderate pain. 08/18/17   Zadie Rhine, MD  terbinafine (LAMISIL AT) 1 % cream Apply 1 application topically 2 (two) times daily. 06/11/18   Arlo Buffone, Mayer Masker, MD    Family History History reviewed. No pertinent family history.  Social History Social History   Tobacco Use  . Smoking status: Current Every Day Smoker    Packs/day: 0.50  . Smokeless tobacco: Never Used    Substance Use Topics  . Alcohol use: Yes    Comment: socially  . Drug use: No     Allergies   Patient has no known allergies.   Review of Systems Review of Systems  Constitutional: Negative for fever.  Musculoskeletal:       Left second toe pain  Skin: Negative for color change and wound.  All other systems reviewed and are negative.    Physical Exam Updated Vital Signs BP (!) 125/91 (BP Location: Left Arm)   Pulse 70   Temp 98 F (36.7 C) (Oral)   Resp 17   Ht 5\' 10"  (1.778 m)   Wt 73 kg (161 lb)   SpO2 100%   BMI 23.10 kg/m   Physical Exam  Constitutional: He is oriented to person, place, and time. He appears well-developed and well-nourished. No distress.  HENT:  Head: Normocephalic and atraumatic.  Neck: Neck supple.  Cardiovascular: Normal rate and regular rhythm.  Pulmonary/Chest: Effort normal. No respiratory distress.  Musculoskeletal: He exhibits no edema.  His examination of the left foot reveals cracks between the toes, between the second and third digit there is slight oozing and moistness, no significant erythema, no palpable fluctuance or induration, normal range of motion of the toes, 2+ DP pulse  Neurological: He is alert and oriented to person,  place, and time.  Skin: Skin is warm and dry.  Psychiatric: He has a normal mood and affect.  Nursing note and vitals reviewed.    ED Treatments / Results  Labs (all labs ordered are listed, but only abnormal results are displayed) Labs Reviewed - No data to display  EKG None  Radiology Dg Toe 2nd Left  Result Date: 06/11/2018 CLINICAL DATA:  Left second toe pain and swelling with discoloration since yesterday. EXAM: LEFT SECOND TOE COMPARISON:  None. FINDINGS: There is no evidence of fracture or dislocation. Mild soft tissue swelling of the toe without underlying bone destruction. No soft tissue emphysema. IMPRESSION: No acute osseous abnormality. No frank bone destruction. Nonspecific mild soft  tissue swelling of the left second toe. Electronically Signed   By: Tollie Ethavid  Kwon M.D.   On: 06/11/2018 03:31    Procedures Procedures (including critical care time)  Medications Ordered in ED Medications - No data to display   Initial Impression / Assessment and Plan / ED Course  I have reviewed the triage vital signs and the nursing notes.  Pertinent labs & imaging results that were available during my care of the patient were reviewed by me and considered in my medical decision making (see chart for details).     Patient presents with left second toe pain.  Continued evidence of athlete's foot.  No obvious infection but he does have moist drainage from between the toes.  He certainly is at risk for secondary infection.  X-ray showed no evidence of bony destruction or deep space infection.  There is mild soft tissue swelling.  Will switch from Lotrimin to Lamisil cream.  Will cover with Keflex to cover for any superimposed infection.  After history, exam, and medical workup I feel the patient has been appropriately medically screened and is safe for discharge home. Pertinent diagnoses were discussed with the patient. Patient was given return precautions.   Final Clinical Impressions(s) / ED Diagnoses   Final diagnoses:  Athlete's foot on left    ED Discharge Orders        Ordered    terbinafine (LAMISIL AT) 1 % cream  2 times daily     06/11/18 0445    cephALEXin (KEFLEX) 500 MG capsule  4 times daily     06/11/18 0445       Torrey Horseman, Mayer Maskerourtney F, MD 06/11/18 [phone]

## 2018-06-11 NOTE — ED Notes (Signed)
Pt ambulated down hallway with steady gait.

## 2018-07-04 ENCOUNTER — Other Ambulatory Visit: Payer: Self-pay

## 2018-07-04 ENCOUNTER — Emergency Department (HOSPITAL_COMMUNITY)
Admission: EM | Admit: 2018-07-04 | Discharge: 2018-07-04 | Disposition: A | Discharge status: Home / Self Care | Payer: Self-pay | Attending: Emergency Medicine | Admitting: Emergency Medicine

## 2018-07-04 ENCOUNTER — Encounter (HOSPITAL_COMMUNITY): Payer: Self-pay | Admitting: Emergency Medicine

## 2018-07-04 DIAGNOSIS — F17200 Nicotine dependence, unspecified, uncomplicated: Secondary | ICD-10-CM | POA: Insufficient documentation

## 2018-07-04 DIAGNOSIS — L0231 Cutaneous abscess of buttock: Secondary | ICD-10-CM | POA: Insufficient documentation

## 2018-07-04 MED ORDER — SULFAMETHOXAZOLE-TRIMETHOPRIM 800-160 MG PO TABS
1.0000 | ORAL_TABLET | Freq: Once | ORAL | Status: AC
  Administered 2018-07-04: 1 via ORAL
  Filled 2018-07-04: qty 1

## 2018-07-04 MED ORDER — LIDOCAINE HCL (PF) 2 % IJ SOLN
4.0000 mL | Freq: Once | INTRAMUSCULAR | Status: AC
  Administered 2018-07-04: 5 mL via INTRADERMAL

## 2018-07-04 MED ORDER — SULFAMETHOXAZOLE-TRIMETHOPRIM 800-160 MG PO TABS: 1.0000 | ORAL_TABLET | Freq: Two times a day (BID) | ORAL | 0 refills | Status: AC

## 2018-07-04 MED ORDER — LIDOCAINE HCL (PF) 2 % IJ SOLN
INTRAMUSCULAR | Status: AC
  Administered 2018-07-04: 5 mL via INTRADERMAL
  Filled 2018-07-04: qty 10

## 2018-07-04 MED ORDER — POVIDONE-IODINE 10 % EX SOLN
CUTANEOUS | Status: AC
  Filled 2018-07-04: qty 15

## 2018-07-04 NOTE — ED Notes (Signed)
Area cleaned and bandaged.

## 2018-07-04 NOTE — Discharge Instructions (Addendum)
Warm water soaks 2-3 times a day.  Keep the area bandaged as needed.  The packing will need to be removed in 2 days.  Take the antibiotic as directed until completed.  Return to the ER for any worsening symptoms

## 2018-07-04 NOTE — ED Triage Notes (Signed)
Pt c/o boil to LT buttock since Wednesday. Denies fever.

## 2018-07-04 NOTE — ED Provider Notes (Signed)
[facility] EMERGENCY DEPARTMENT Provider Note   CSN: [id number] Arrival date & time: 07/04/18  1837     History   Chief Complaint Chief Complaint  Patient presents with  . Abscess    HPI Verdis FredericksonReginald R Lookingbill is a 32 y.o. male.  HPI   Verdis FredericksonReginald R Banke is a 32 y.o. male who presents to the Emergency Department complaining of an area of redness and pain to his left buttock, worsening for 2 days.  Reports hx of boils and pain feels similar to previous.  He has been soaking in warm water without relief.  Pain is associated with palpation and sitting.  He denies fever, chills, pain or difficulty with defecation or urination, abdominal pain or vomiting.     Past Medical History:  Diagnosis Date  . Strep throat     There are no active problems to display for this patient.   History reviewed. No pertinent surgical history.      Home Medications    Prior to Admission medications   Medication Sig Start Date End Date Taking? Authorizing Provider  cefdinir (OMNICEF) 300 MG capsule Take 1 capsule (300 mg total) by mouth 2 (two) times daily. 05/04/18   Ivery QualeBryant, Hobson, PA-C  cephALEXin (KEFLEX) 500 MG capsule Take 1 capsule (500 mg total) by mouth 4 (four) times daily. 06/11/18   Horton, Mayer Maskerourtney F, MD  ibuprofen (ADVIL,MOTRIN) 600 MG tablet Take 1 tablet (600 mg total) by mouth every 8 (eight) hours as needed for moderate pain. 08/18/17   Zadie RhineWickline, Donald, MD  terbinafine (LAMISIL AT) 1 % cream Apply 1 application topically 2 (two) times daily. 06/11/18   Horton, Mayer Maskerourtney F, MD    Family History No family history on file.  Social History Social History   Tobacco Use  . Smoking status: Current Every Day Smoker    Packs/day: 0.50  . Smokeless tobacco: Never Used  Substance Use Topics  . Alcohol use: Yes    Comment: socially  . Drug use: No     Allergies   Patient has no known allergies.   Review of Systems Review of Systems  Constitutional: Negative for chills and  fever.  Gastrointestinal: Negative for nausea and vomiting.  Musculoskeletal: Negative for arthralgias and joint swelling.  Skin: Positive for color change.       Redness pain to mid left buttock  Hematological: Negative for adenopathy.  All other systems reviewed and are negative.    Physical Exam Updated Vital Signs BP 126/88 (BP Location: Right Arm)   Pulse 90   Temp 98.9 F (37.2 C) (Oral)   Resp 16   Ht 5\' 10"  (1.778 m)   Wt 73 kg (161 lb)   SpO2 100%   BMI 23.10 kg/m   Physical Exam  Constitutional: He appears well-developed and well-nourished. No distress.  HENT:  Head: Normocephalic and atraumatic.  Cardiovascular: Normal rate, regular rhythm and normal heart sounds.  Pulmonary/Chest: Effort normal and breath sounds normal. No respiratory distress.  Abdominal: Soft. There is no tenderness.  Neurological: He is alert. No sensory deficit.  Skin: Skin is warm and dry. There is erythema.  Focal area of erythema and induration to the mid left buttock near the mid gluteal cleft. It does not encroach on the anus.  No drainage or fluctuance.    Psychiatric: He has a normal mood and affect.  Nursing note and vitals reviewed.    ED Treatments / Results  Labs (all labs ordered are listed, but only abnormal  results are displayed) Labs Reviewed - No data to display  EKG None  Radiology No results found.  Procedures Procedures (including critical care time)  INCISION AND DRAINAGE Performed by: Maxwell Caul. Consent: Verbal consent obtained. Risks and benefits: risks, benefits and alternatives were discussed Type: abscess  Body area: left buttock  Anesthesia: local infiltration  Incision was made with a #11 scalpel.  Local anesthetic: lidocaine 2 % w/o epinephrine  Anesthetic total: 3 ml  Complexity: complex Blunt dissection to break up loculations  Drainage: purulent  Drainage amount: small  Packing material: 1/4 in iodoform gauze  Patient  tolerance: Patient tolerated the procedure well with no immediate complications.     Medications Ordered in ED Medications  lidocaine (XYLOCAINE) 2 % injection 4 mL (has no administration in time range)  povidone-iodine (BETADINE) 10 % external solution (has no administration in time range)  lidocaine (XYLOCAINE) 2 % injection (has no administration in time range)     Initial Impression / Assessment and Plan / ED Course  I have reviewed the triage vital signs and the nursing notes.  Pertinent labs & imaging results that were available during my care of the patient were reviewed by me and considered in my medical decision making (see chart for details).    Pt well appearing, non-toxic. Small abscess to left buttock. Hx of same.  I&D performed.  Care instructions provided.  Pt agrees to packing removal in 2 days.    Final Clinical Impressions(s) / ED Diagnoses   Final diagnoses:  Abscess of buttock, left    ED Discharge Orders    None       Pauline Aus, PA-C 07/06/18 1843    Samuel Jester, DO 07/09/18 [phone]

## 2019-03-05 ENCOUNTER — Other Ambulatory Visit: Payer: Self-pay

## 2019-03-05 ENCOUNTER — Emergency Department (HOSPITAL_COMMUNITY)
Admission: EM | Admit: 2019-03-05 | Discharge: 2019-03-05 | Disposition: A | Discharge status: Home / Self Care | Payer: Self-pay | Attending: Emergency Medicine | Admitting: Emergency Medicine

## 2019-03-05 ENCOUNTER — Encounter (HOSPITAL_COMMUNITY): Payer: Self-pay | Admitting: Emergency Medicine

## 2019-03-05 DIAGNOSIS — Z79899 Other long term (current) drug therapy: Secondary | ICD-10-CM | POA: Insufficient documentation

## 2019-03-05 DIAGNOSIS — J069 Acute upper respiratory infection, unspecified: Secondary | ICD-10-CM | POA: Insufficient documentation

## 2019-03-05 DIAGNOSIS — B9789 Other viral agents as the cause of diseases classified elsewhere: Secondary | ICD-10-CM

## 2019-03-05 DIAGNOSIS — F17210 Nicotine dependence, cigarettes, uncomplicated: Secondary | ICD-10-CM | POA: Insufficient documentation

## 2019-03-05 NOTE — ED Triage Notes (Signed)
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

## 2019-03-05 NOTE — ED Notes (Signed)
Please refer to down time documentation

## 2019-03-05 NOTE — ED Notes (Signed)
Pt was discharged from the ED at 0930

## 2019-03-14 NOTE — ED Provider Notes (Signed)
[facility] EMERGENCY DEPARTMENT Provider Note   CSN: [id number] Arrival date & time: 03/05/19  5643    History   Chief Complaint Chief Complaint  Patient presents with  . Cough    HPI Scott [name] is a 33 y.o. male.     HPI   33 year old male with cough.  Onset 3 days ago.  Mild headache and sore throat which is since improved.  Try to return to work today but his employer advised that he needed to work note to come back.  No fevers.  No rash.  Past Medical History:  Diagnosis Date  . Strep throat     There are no active problems to display for this patient.   History reviewed. No pertinent surgical history.      Home Medications    Prior to Admission medications   Medication Sig Start Date End Date Taking? Authorizing Provider  cefdinir (OMNICEF) 300 MG capsule Take 1 capsule (300 mg total) by mouth 2 (two) times daily. 05/04/18   [name], PA-C  cephALEXin (KEFLEX) 500 MG capsule Take 1 capsule (500 mg total) by mouth 4 (four) times daily. 06/11/18   Horton, Mayer Masker, MD  ibuprofen (ADVIL,MOTRIN) 600 MG tablet Take 1 tablet (600 mg total) by mouth every 8 (eight) hours as needed for moderate pain. 08/18/17   Zadie Rhine, MD  terbinafine (LAMISIL AT) 1 % cream Apply 1 application topically 2 (two) times daily. 06/11/18   Horton, Mayer Masker, MD    Family History History reviewed. No pertinent family history.  Social History Social History   Tobacco Use  . Smoking status: Current Every Day Smoker    Packs/day: 0.50  . Smokeless tobacco: Never Used  Substance Use Topics  . Alcohol use: Yes    Comment: socially  . Drug use: No     Allergies   Patient has no known allergies.   Review of Systems Review of Systems  All systems reviewed and negative, other than as noted in HPI.  Physical Exam Updated Vital Signs BP (!) 123/93 (BP Location: Right Arm)   Pulse 82   Temp 98.5 F (36.9 C) (Oral)   Resp 16   Ht 5\' 10"  (1.778 m)   Wt  72.6 kg   SpO2 97%   BMI 22.96 kg/m   Physical Exam Vitals signs and nursing note reviewed.  Constitutional:      General: He is not in acute distress.    Appearance: He is well-developed.  HENT:     Head: Normocephalic and atraumatic.  Eyes:     General:        Right eye: No discharge.        Left eye: No discharge.     Conjunctiva/sclera: Conjunctivae normal.  Neck:     Musculoskeletal: Neck supple.  Cardiovascular:     Rate and Rhythm: Normal rate and regular rhythm.     Heart sounds: Normal heart sounds. No murmur. No friction rub. No gallop.   Pulmonary:     Effort: Pulmonary effort is normal. No respiratory distress.     Breath sounds: Normal breath sounds.  Abdominal:     General: There is no distension.     Palpations: Abdomen is soft.     Tenderness: There is no abdominal tenderness.  Musculoskeletal:        General: No tenderness.  Skin:    General: Skin is warm and dry.  Neurological:     Mental Status: He is alert.  Psychiatric:        Behavior: Behavior normal.        Thought Content: Thought content normal.      ED Treatments / Results  Labs (all labs ordered are listed, but only abnormal results are displayed) Labs Reviewed - No data to display  EKG None  Radiology No results found.  Procedures Procedures (including critical care time)  Medications Ordered in ED Medications - No data to display   Initial Impression / Assessment and Plan / ED Course  I have reviewed the triage vital signs and the nursing notes.  Pertinent labs & imaging results that were available during my care of the patient were reviewed by me and considered in my medical decision making (see chart for details).       33 year old male with likely viral respiratory infection.  Is afebrile.  Generally well-appearing.  No increased work of breathing.  Lung sounds clear.  I doubt serious bacterial infection like pneumonia.  Doubt influenza.  Overall I have a low  suspicion for emergent process.  Final Clinical Impressions(s) / ED Diagnoses   Final diagnoses:  Viral URI with cough    ED Discharge Orders    None       Raeford Razor, MD 03/14/19 [phone]

## 2019-12-11 ENCOUNTER — Other Ambulatory Visit: Payer: Self-pay

## 2019-12-11 ENCOUNTER — Emergency Department (HOSPITAL_COMMUNITY)
Admission: EM | Admit: 2019-12-11 | Discharge: 2019-12-11 | Disposition: A | Discharge status: Home / Self Care | Payer: Self-pay | Attending: Emergency Medicine | Admitting: Emergency Medicine

## 2019-12-11 ENCOUNTER — Encounter (HOSPITAL_COMMUNITY): Payer: Self-pay

## 2019-12-11 DIAGNOSIS — Z5321 Procedure and treatment not carried out due to patient leaving prior to being seen by health care provider: Secondary | ICD-10-CM | POA: Insufficient documentation

## 2019-12-11 DIAGNOSIS — K0889 Other specified disorders of teeth and supporting structures: Secondary | ICD-10-CM | POA: Insufficient documentation

## 2019-12-11 NOTE — ED Triage Notes (Signed)
Pt presents to ED with complaints of top right tooth pain started today. Pt denies fever.

## 2019-12-11 NOTE — ED Notes (Signed)
Pt still not in room. 

## 2019-12-11 NOTE — ED Notes (Signed)
Pt not in room at this time

## 2020-02-27 ENCOUNTER — Encounter (HOSPITAL_COMMUNITY): Payer: Self-pay

## 2020-02-27 ENCOUNTER — Other Ambulatory Visit: Payer: Self-pay

## 2020-02-27 ENCOUNTER — Emergency Department (HOSPITAL_COMMUNITY)
Admission: EM | Admit: 2020-02-27 | Discharge: 2020-02-27 | Disposition: A | Discharge status: Home / Self Care | Payer: Self-pay | Attending: Emergency Medicine | Admitting: Emergency Medicine

## 2020-02-27 DIAGNOSIS — F17210 Nicotine dependence, cigarettes, uncomplicated: Secondary | ICD-10-CM | POA: Insufficient documentation

## 2020-02-27 DIAGNOSIS — Z711 Person with feared health complaint in whom no diagnosis is made: Secondary | ICD-10-CM

## 2020-02-27 DIAGNOSIS — Z114 Encounter for screening for human immunodeficiency virus [HIV]: Secondary | ICD-10-CM | POA: Insufficient documentation

## 2020-02-27 DIAGNOSIS — R360 Urethral discharge without blood: Secondary | ICD-10-CM | POA: Insufficient documentation

## 2020-02-27 DIAGNOSIS — Z113 Encounter for screening for infections with a predominantly sexual mode of transmission: Secondary | ICD-10-CM | POA: Insufficient documentation

## 2020-02-27 LAB — URINALYSIS, ROUTINE W REFLEX MICROSCOPIC
Bilirubin Urine: NEGATIVE
Glucose, UA: NEGATIVE mg/dL
Hgb urine dipstick: NEGATIVE
Ketones, ur: NEGATIVE mg/dL
Leukocytes,Ua: NEGATIVE
Nitrite: NEGATIVE
Protein, ur: NEGATIVE mg/dL
Specific Gravity, Urine: 1.015 (ref 1.005–1.030)
pH: 8 (ref 5.0–8.0)

## 2020-02-27 LAB — HIV ANTIBODY (ROUTINE TESTING W REFLEX): HIV Screen 4th Generation wRfx: NONREACTIVE

## 2020-02-27 MED ORDER — CEFTRIAXONE SODIUM 250 MG IJ SOLR
250.0000 mg | Freq: Once | INTRAMUSCULAR | Status: AC
  Administered 2020-02-27: 250 mg via INTRAMUSCULAR
  Filled 2020-02-27: qty 250

## 2020-02-27 MED ORDER — STERILE WATER FOR INJECTION IJ SOLN
INTRAMUSCULAR | Status: AC
  Administered 2020-02-27: 1 mL
  Filled 2020-02-27: qty 10

## 2020-02-27 MED ORDER — DOXYCYCLINE HYCLATE 100 MG PO CAPS: 100.0000 mg | ORAL_CAPSULE | Freq: Two times a day (BID) | ORAL | 0 refills | Status: DC

## 2020-02-27 NOTE — ED Triage Notes (Signed)
Pt reports yellowish discharge from penis that started 2 days ago, also reports discomfort to genital area. Pt reports "condom broke" while having intercourse.

## 2020-02-27 NOTE — ED Provider Notes (Signed)
[facility] EMERGENCY DEPARTMENT Provider Note   CSN: [id number] Arrival date & time: 02/27/20  0402   Time seen 5:30 AM  History Chief Complaint  Patient presents with  . Exposure to STD    Scott [name] is a 34 y.o. male.  HPI   Patient relates he has had dysuria for a couple days and then tonight started having penile drainage.  He states he has had this before with STDs.  He states he had sex with a new partner last week but the "condom broke".  He states he has noted some lymph nodes in his groin.  He states he is able to urinate.  PCP Patient, No Pcp Per   Past Medical History:  Diagnosis Date  . Strep throat     There are no problems to display for this patient.   History reviewed. No pertinent surgical history.     No family history on file.  Social History   Tobacco Use  . Smoking status: Current Every Day Smoker    Packs/day: 0.50    Types: Cigarettes  . Smokeless tobacco: Never Used  Substance Use Topics  . Alcohol use: Yes    Comment: socially  . Drug use: No    Home Medications Prior to Admission medications   Medication Sig Start Date End Date Taking? Authorizing Provider  cefdinir (OMNICEF) 300 MG capsule Take 1 capsule (300 mg total) by mouth 2 (two) times daily. 05/04/18   [name], PA-C  cephALEXin (KEFLEX) 500 MG capsule Take 1 capsule (500 mg total) by mouth 4 (four) times daily. 06/11/18   Horton, Mayer Masker, MD  doxycycline (VIBRAMYCIN) 100 MG capsule Take 1 capsule (100 mg total) by mouth 2 (two) times daily. 02/27/20   Devoria Albe, MD  ibuprofen (ADVIL,MOTRIN) 600 MG tablet Take 1 tablet (600 mg total) by mouth every 8 (eight) hours as needed for moderate pain. 08/18/17   Zadie Rhine, MD  terbinafine (LAMISIL AT) 1 % cream Apply 1 application topically 2 (two) times daily. 06/11/18   Horton, Mayer Masker, MD    Allergies    Patient has no known allergies.  Review of Systems   Review of Systems  All other systems reviewed and  are negative.   Physical Exam Updated Vital Signs BP 120/82 (BP Location: Left Arm)   Pulse 80   Temp 98.2 F (36.8 C) (Oral)   Resp 17   Wt 70.3 kg   SpO2 98%   BMI 22.24 kg/m   Physical Exam Vitals and nursing note reviewed.  Constitutional:      General: He is not in acute distress.    Appearance: Normal appearance. He is normal weight.  HENT:     Head: Normocephalic and atraumatic.     Right Ear: External ear normal.     Left Ear: External ear normal.     Nose: Nose normal.  Eyes:     Extraocular Movements: Extraocular movements intact.     Conjunctiva/sclera: Conjunctivae normal.     Pupils: Pupils are equal, round, and reactive to light.  Cardiovascular:     Rate and Rhythm: Normal rate and regular rhythm.  Pulmonary:     Effort: Pulmonary effort is normal. No respiratory distress.  Genitourinary:    Penis: Normal.      Testes: Normal.     Comments: Patient has a few pea-sized rubbery lymph nodes in his groin on the left, there is no obvious penile drainage at this time.  Chaperone  was present in the room, Dellis Filbert RN Musculoskeletal:     Cervical back: Normal range of motion.  Skin:    General: Skin is warm and dry.     Findings: No rash.  Neurological:     General: No focal deficit present.     Mental Status: He is alert and oriented to person, place, and time.     Cranial Nerves: No cranial nerve deficit.  Psychiatric:        Mood and Affect: Mood normal.        Behavior: Behavior normal.        Thought Content: Thought content normal.     ED Results / Procedures / Treatments   Labs (all labs ordered are listed, but only abnormal results are displayed)  Results for orders placed or performed during the hospital encounter of 02/27/20  Urinalysis, Routine w reflex microscopic  Result Value Ref Range   Color, Urine YELLOW YELLOW   APPearance CLEAR CLEAR   Specific Gravity, Urine 1.015 1.005 - 1.030   pH 8.0 5.0 - 8.0   Glucose, UA NEGATIVE NEGATIVE  mg/dL   Hgb urine dipstick NEGATIVE NEGATIVE   Bilirubin Urine NEGATIVE NEGATIVE   Ketones, ur NEGATIVE NEGATIVE mg/dL   Protein, ur NEGATIVE NEGATIVE mg/dL   Nitrite NEGATIVE NEGATIVE   Leukocytes,Ua NEGATIVE NEGATIVE   Laboratory interpretation all normal    EKG None  Radiology No results found.  Procedures Procedures (including critical care time)  Medications Ordered in ED Medications  cefTRIAXone (ROCEPHIN) injection 250 mg (has no administration in time range)    ED Course  I have reviewed the triage vital signs and the nursing notes.  Pertinent labs & imaging results that were available during my care of the patient were reviewed by me and considered in my medical decision making (see chart for details).    MDM Rules/Calculators/A&P                      Patient wanted to be empirically treated for STDs.  He received Rocephin 250 mg IM.  He was discharged home with doxycycline for 10 days.  He also was tested for HIV and syphilis.    Final Clinical Impression(s) / ED Diagnoses Final diagnoses:  Concern about STD in male without diagnosis    Rx / DC Orders ED Discharge Orders         Ordered    doxycycline (VIBRAMYCIN) 100 MG capsule  2 times daily     02/27/20 0625         Plan discharge  Rolland Porter, MD, Barbette Or, MD 02/27/20 [phone]

## 2020-02-27 NOTE — Discharge Instructions (Addendum)
Take the antibiotic until gone.  You will be called if your tests are positive or you can check in my chart in a couple of days.  You should let all your sexual partners know that they should be evaluated for possible infection.  You should use condoms until you finish the antibiotics.

## 2020-02-28 LAB — RPR: RPR Ser Ql: NONREACTIVE

## 2020-03-02 LAB — GC/CHLAMYDIA PROBE AMP (~~LOC~~) NOT AT ARMC
Chlamydia: NEGATIVE
Neisseria Gonorrhea: NEGATIVE

## 2020-05-18 ENCOUNTER — Other Ambulatory Visit: Payer: Self-pay

## 2020-05-18 ENCOUNTER — Emergency Department (HOSPITAL_COMMUNITY)
Admission: EM | Admit: 2020-05-18 | Discharge: 2020-05-18 | Disposition: A | Discharge status: Home / Self Care | Payer: Self-pay | Attending: Emergency Medicine | Admitting: Emergency Medicine

## 2020-05-18 ENCOUNTER — Encounter (HOSPITAL_COMMUNITY): Payer: Self-pay | Admitting: Emergency Medicine

## 2020-05-18 DIAGNOSIS — F17210 Nicotine dependence, cigarettes, uncomplicated: Secondary | ICD-10-CM | POA: Insufficient documentation

## 2020-05-18 DIAGNOSIS — Z79899 Other long term (current) drug therapy: Secondary | ICD-10-CM | POA: Insufficient documentation

## 2020-05-18 DIAGNOSIS — Z711 Person with feared health complaint in whom no diagnosis is made: Secondary | ICD-10-CM

## 2020-05-18 DIAGNOSIS — R369 Urethral discharge, unspecified: Secondary | ICD-10-CM

## 2020-05-18 DIAGNOSIS — A549 Gonococcal infection, unspecified: Secondary | ICD-10-CM | POA: Insufficient documentation

## 2020-05-18 LAB — RPR: RPR Ser Ql: NONREACTIVE

## 2020-05-18 LAB — HIV ANTIBODY (ROUTINE TESTING W REFLEX): HIV Screen 4th Generation wRfx: NONREACTIVE

## 2020-05-18 MED ORDER — AZITHROMYCIN 250 MG PO TABS
1000.0000 mg | ORAL_TABLET | Freq: Once | ORAL | Status: AC
  Administered 2020-05-18: 1000 mg via ORAL
  Filled 2020-05-18: qty 4

## 2020-05-18 MED ORDER — ONDANSETRON 4 MG PO TBDP
4.0000 mg | ORAL_TABLET | Freq: Once | ORAL | Status: AC
  Administered 2020-05-18: 4 mg via ORAL
  Filled 2020-05-18: qty 1

## 2020-05-18 MED ORDER — CEFTRIAXONE SODIUM 500 MG IJ SOLR
500.0000 mg | Freq: Once | INTRAMUSCULAR | Status: AC
  Administered 2020-05-18: 500 mg via INTRAMUSCULAR
  Filled 2020-05-18: qty 500

## 2020-05-18 NOTE — Discharge Instructions (Addendum)
You can check in my chart in a few days for the results of your syphilis test and your HIV test.  You need to let all your sexual partners know they need to be evaluated for GC and chlamydia.  You need to be safer about your sexual habits and use condoms.

## 2020-05-18 NOTE — ED Triage Notes (Signed)
Pt here c/o yellow penile discharge since this morning. Pt was seen at [facility] yesterday for the same.

## 2020-05-18 NOTE — ED Provider Notes (Signed)
[facility] EMERGENCY DEPARTMENT Provider Note   CSN: [id number] Arrival date & time: 05/18/20  0310   Time seen 3:45 AM  History Chief Complaint  Patient presents with  . Exposure to STD    Scott [name] is a 34 y.o. male.  HPI   Patient states about 8:30 in the morning on May 25 he started having penile drainage without dysuria.  He denies any penile sores.  He states he went to [facility] and they did testing however they did not treat him.  They were going to wait for his test results to return.  Patient states he still having penile drip.  I actually saw him in March with similar complaints and he states he no longer sees that woman and he now is back with a different woman.  He states he told the first 1 that she needed to be treated.  PCP Patient, No Pcp Per   Past Medical History:  Diagnosis Date  . Strep throat     There are no problems to display for this patient.   History reviewed. No pertinent surgical history.     History reviewed. No pertinent family history.  Social History   Tobacco Use  . Smoking status: Current Every Day Smoker    Packs/day: 0.50    Types: Cigarettes  . Smokeless tobacco: Never Used  Substance Use Topics  . Alcohol use: Yes    Comment: socially  . Drug use: No    Home Medications Prior to Admission medications   Medication Sig Start Date End Date Taking? Authorizing Provider  cefdinir (OMNICEF) 300 MG capsule Take 1 capsule (300 mg total) by mouth 2 (two) times daily. 05/04/18   [name], PA-C  cephALEXin (KEFLEX) 500 MG capsule Take 1 capsule (500 mg total) by mouth 4 (four) times daily. 06/11/18   Horton, Mayer Masker, MD  doxycycline (VIBRAMYCIN) 100 MG capsule Take 1 capsule (100 mg total) by mouth 2 (two) times daily. 02/27/20   Devoria Albe, MD  ibuprofen (ADVIL,MOTRIN) 600 MG tablet Take 1 tablet (600 mg total) by mouth every 8 (eight) hours as needed for moderate pain. 08/18/17   Zadie Rhine, MD  terbinafine (LAMISIL  AT) 1 % cream Apply 1 application topically 2 (two) times daily. 06/11/18   Horton, Mayer Masker, MD    Allergies    Patient has no known allergies.  Review of Systems   Review of Systems  All other systems reviewed and are negative.   Physical Exam Updated Vital Signs BP 117/84 (BP Location: Right Arm)   Pulse 76   Temp 97.8 F (36.6 C) (Oral)   Resp 16   Ht 5\' 10"  (1.778 m)   Wt 72.6 kg   SpO2 100%   BMI 22.96 kg/m   Physical Exam Vitals and nursing note reviewed.  Constitutional:      Appearance: Normal appearance. He is normal weight.  HENT:     Head: Normocephalic and atraumatic.  Eyes:     Extraocular Movements: Extraocular movements intact.     Conjunctiva/sclera: Conjunctivae normal.     Pupils: Pupils are equal, round, and reactive to light.  Cardiovascular:     Rate and Rhythm: Normal rate.  Pulmonary:     Effort: Pulmonary effort is normal. No respiratory distress.  Genitourinary:    Penis: Normal.      Comments: Patient is noted to have white urethral drainage.  There were no ulcers noted on his penis. Musculoskeletal:  Cervical back: Normal range of motion.  Skin:    General: Skin is warm and dry.  Neurological:     General: No focal deficit present.     Mental Status: He is alert and oriented to person, place, and time.     Cranial Nerves: No cranial nerve deficit.  Psychiatric:        Mood and Affect: Mood normal.        Behavior: Behavior normal.        Thought Content: Thought content normal.     ED Results / Procedures / Treatments   Labs (all labs ordered are listed, but only abnormal results are displayed) Labs Reviewed  HIV ANTIBODY (ROUTINE TESTING W REFLEX)  RPR  GC/CHLAMYDIA PROBE AMP ([place]) NOT AT [facility]   Patient's test are pending   EKG None  Radiology No results found.  Procedures Procedures (including critical care time)  Medications Ordered in ED Medications  cefTRIAXone (ROCEPHIN) injection 500 mg (500  mg Intramuscular Given 05/18/20 0406)  azithromycin (ZITHROMAX) tablet 1,000 mg (1,000 mg Oral Given 05/18/20 0406)  ondansetron (ZOFRAN-ODT) disintegrating tablet 4 mg (4 mg Oral Given 05/18/20 0406)    ED Course  I have reviewed the triage vital signs and the nursing notes.  Pertinent labs & imaging results that were available during my care of the patient were reviewed by me and considered in my medical decision making (see chart for details).    MDM Rules/Calculators/A&P                      Patient states he wants to be tested for STDs that he also wants to be tested for HIV and syphilis.  He was given Rocephin 500 mg IM and Zithromax 1 g orally with Zofran for anticipated nausea.  He is advised to have all his sexual partners notified so they can get treated.  Patient was just seen by myself in March for similar symptoms, he needs to use safer sex precautions.   Final Clinical Impression(s) / ED Diagnoses Final diagnoses:  Penile discharge  Concern about STD in male without diagnosis    Rx / DC Orders ED Discharge Orders    None      Plan discharge  Rolland Porter, MD, Barbette Or, MD 05/18/20 [phone]

## 2020-05-19 LAB — GC/CHLAMYDIA PROBE AMP (~~LOC~~) NOT AT ARMC
Chlamydia: NEGATIVE
Comment: NEGATIVE
Comment: NORMAL
Neisseria Gonorrhea: POSITIVE — AB

## 2020-12-01 ENCOUNTER — Emergency Department (HOSPITAL_COMMUNITY)
Admission: EM | Admit: 2020-12-01 | Discharge: 2020-12-01 | Disposition: A | Discharge status: Home / Self Care | Payer: Self-pay | Attending: Emergency Medicine | Admitting: Emergency Medicine

## 2020-12-01 ENCOUNTER — Other Ambulatory Visit: Payer: Self-pay

## 2020-12-01 ENCOUNTER — Encounter (HOSPITAL_COMMUNITY): Payer: Self-pay

## 2020-12-01 DIAGNOSIS — F17210 Nicotine dependence, cigarettes, uncomplicated: Secondary | ICD-10-CM | POA: Insufficient documentation

## 2020-12-01 DIAGNOSIS — N342 Other urethritis: Secondary | ICD-10-CM | POA: Insufficient documentation

## 2020-12-01 MED ORDER — AZITHROMYCIN 1 G PO PACK
1.0000 g | PACK | Freq: Once | ORAL | Status: AC
  Administered 2020-12-01: 1 g via ORAL
  Filled 2020-12-01: qty 1

## 2020-12-01 MED ORDER — CEFTRIAXONE SODIUM 500 MG IJ SOLR
500.0000 mg | Freq: Once | INTRAMUSCULAR | Status: AC
  Administered 2020-12-01: 500 mg via INTRAMUSCULAR
  Filled 2020-12-01: qty 500

## 2020-12-01 NOTE — Discharge Instructions (Addendum)
Follow-up with the health department in 1 to 2 weeks or your primary care doctor.  Use a condom until you are seen in follow-up and the doctor says it is okay not to

## 2020-12-01 NOTE — ED Provider Notes (Signed)
[facility] EMERGENCY DEPARTMENT Provider Note   CSN: [id number] Arrival date & time: 12/01/20  7829     History Chief Complaint  Patient presents with  . Penile Discharge    Scott [name] is a 34 y.o. male.  Pt complains of penile discharge.   This is been going on a couple days.  The discharge is milky yellow  The history is provided by the patient and medical records. No language interpreter was used.  Penile Discharge This is a new problem. The current episode started 2 days ago. The problem occurs constantly. The problem has not changed since onset.Pertinent negatives include no chest pain, no abdominal pain and no headaches. Nothing aggravates the symptoms. Nothing relieves the symptoms. He has tried nothing for the symptoms. The treatment provided no relief.       Past Medical History:  Diagnosis Date  . Strep throat     There are no problems to display for this patient.   History reviewed. No pertinent surgical history.     No family history on file.  Social History   Tobacco Use  . Smoking status: Current Every Day Smoker    Packs/day: 0.50    Types: Cigarettes  . Smokeless tobacco: Never Used  Vaping Use  . Vaping Use: Never used  Substance Use Topics  . Alcohol use: Yes    Comment: socially  . Drug use: No    Home Medications Prior to Admission medications   Medication Sig Start Date End Date Taking? Authorizing Provider  cefdinir (OMNICEF) 300 MG capsule Take 1 capsule (300 mg total) by mouth 2 (two) times daily. Patient not taking: No sig reported 05/04/18   [name], PA-C  cephALEXin (KEFLEX) 500 MG capsule Take 1 capsule (500 mg total) by mouth 4 (four) times daily. Patient not taking: No sig reported 06/11/18   Horton, Mayer Masker, MD  doxycycline (VIBRAMYCIN) 100 MG capsule Take 1 capsule (100 mg total) by mouth 2 (two) times daily. Patient not taking: No sig reported 02/27/20   Devoria Albe, MD  ibuprofen (ADVIL,MOTRIN) 600 MG tablet  Take 1 tablet (600 mg total) by mouth every 8 (eight) hours as needed for moderate pain. Patient not taking: No sig reported 08/18/17   Zadie Rhine, MD  terbinafine (LAMISIL AT) 1 % cream Apply 1 application topically 2 (two) times daily. Patient not taking: No sig reported 06/11/18   Horton, Mayer Masker, MD    Allergies    Patient has no known allergies.  Review of Systems   Review of Systems  Constitutional: Negative for appetite change and fatigue.  HENT: Negative for congestion, ear discharge and sinus pressure.   Eyes: Negative for discharge.  Respiratory: Negative for cough.   Cardiovascular: Negative for chest pain.  Gastrointestinal: Negative for abdominal pain and diarrhea.  Genitourinary: Positive for penile discharge. Negative for frequency and hematuria.  Musculoskeletal: Negative for back pain.  Skin: Negative for rash.  Neurological: Negative for seizures and headaches.  Psychiatric/Behavioral: Negative for hallucinations.    Physical Exam Updated Vital Signs BP 131/86 (BP Location: Left Arm)   Pulse 68   Resp 16   SpO2 99%   Physical Exam Vitals and nursing note reviewed.  Constitutional:      Appearance: He is well-developed.  HENT:     Head: Normocephalic.     Nose: Nose normal.  Eyes:     Conjunctiva/sclera: Conjunctivae normal.  Neck:     Trachea: No tracheal deviation.  Cardiovascular:  Rate and Rhythm: Normal rate and regular rhythm.  Pulmonary:     Effort: Pulmonary effort is normal.  Genitourinary:    Comments: Small amount of yellow discharge Musculoskeletal:        General: Normal range of motion.  Skin:    General: Skin is warm.  Neurological:     Mental Status: He is alert and oriented to person, place, and time.  Psychiatric:        Mood and Affect: Mood and affect normal.     ED Results / Procedures / Treatments   Labs (all labs ordered are listed, but only abnormal results are displayed) Labs Reviewed  GC/CHLAMYDIA  PROBE AMP ([place]) NOT AT [facility]    EKG None  Radiology No results found.  Procedures Procedures (including critical care time)  Medications Ordered in ED Medications  cefTRIAXone (ROCEPHIN) injection 500 mg (has no administration in time range)  azithromycin (ZITHROMAX) powder 1 g (has no administration in time range)    ED Course  I have reviewed the triage vital signs and the nursing notes.  Pertinent labs & imaging results that were available during my care of the patient were reviewed by me and considered in my medical decision making (see chart for details).    MDM Rules/Calculators/A&P                          Patient with penile discharge.  He will be empirically treated with Rocephin and Zithromax and follow-up with health department next week Final Clinical Impression(s) / ED Diagnoses Final diagnoses:  Urethritis    Rx / DC Orders ED Discharge Orders    None       Bethann Berkshire, MD 12/01/20 [phone]

## 2020-12-01 NOTE — ED Triage Notes (Addendum)
Pt reports yellow penile drainage he noticed yesterday and has a tingling

## 2020-12-06 LAB — GC/CHLAMYDIA PROBE AMP (~~LOC~~) NOT AT ARMC
Chlamydia: NEGATIVE
Comment: NEGATIVE
Comment: NORMAL
Neisseria Gonorrhea: POSITIVE — AB

## 2021-09-06 ENCOUNTER — Encounter (HOSPITAL_COMMUNITY): Payer: Self-pay | Admitting: Emergency Medicine

## 2021-09-06 ENCOUNTER — Emergency Department (HOSPITAL_COMMUNITY)
Admission: EM | Admit: 2021-09-06 | Discharge: 2021-09-06 | Disposition: A | Discharge status: Home / Self Care | Payer: Self-pay | Attending: Emergency Medicine | Admitting: Emergency Medicine

## 2021-09-06 ENCOUNTER — Other Ambulatory Visit: Payer: Self-pay

## 2021-09-06 DIAGNOSIS — Z5321 Procedure and treatment not carried out due to patient leaving prior to being seen by health care provider: Secondary | ICD-10-CM | POA: Insufficient documentation

## 2021-09-06 DIAGNOSIS — K122 Cellulitis and abscess of mouth: Secondary | ICD-10-CM | POA: Insufficient documentation

## 2021-09-06 NOTE — ED Triage Notes (Signed)
Pt c/o abscess to lip x 2 days.

## 2022-08-15 ENCOUNTER — Other Ambulatory Visit: Payer: Self-pay

## 2022-08-15 ENCOUNTER — Emergency Department (HOSPITAL_COMMUNITY)
Admission: EM | Admit: 2022-08-15 | Discharge: 2022-08-15 | Disposition: A | Discharge status: Home / Self Care | Payer: BC Managed Care – PPO | Attending: Emergency Medicine | Admitting: Emergency Medicine

## 2022-08-15 ENCOUNTER — Emergency Department (HOSPITAL_COMMUNITY): Payer: BC Managed Care – PPO

## 2022-08-15 DIAGNOSIS — S62525A Nondisplaced fracture of distal phalanx of left thumb, initial encounter for closed fracture: Secondary | ICD-10-CM | POA: Diagnosis not present

## 2022-08-15 DIAGNOSIS — S6010XA Contusion of unspecified finger with damage to nail, initial encounter: Secondary | ICD-10-CM

## 2022-08-15 DIAGNOSIS — W208XXA Other cause of strike by thrown, projected or falling object, initial encounter: Secondary | ICD-10-CM | POA: Diagnosis not present

## 2022-08-15 DIAGNOSIS — S6702XA Crushing injury of left thumb, initial encounter: Secondary | ICD-10-CM | POA: Diagnosis present

## 2022-08-15 MED ORDER — LIDOCAINE HCL (PF) 2 % IJ SOLN
5.0000 mL | Freq: Once | INTRAMUSCULAR | Status: AC
  Administered 2022-08-15: 5 mL via INTRADERMAL
  Filled 2022-08-15: qty 10

## 2022-08-15 MED ORDER — OXYCODONE-ACETAMINOPHEN 5-325 MG PO TABS
1.0000 | ORAL_TABLET | Freq: Once | ORAL | Status: AC
  Administered 2022-08-15: 1 via ORAL
  Filled 2022-08-15: qty 1

## 2022-08-15 MED ORDER — CEPHALEXIN 500 MG PO CAPS: 500.0000 mg | ORAL_CAPSULE | Freq: Three times a day (TID) | ORAL | 0 refills | Status: DC

## 2022-08-15 MED ORDER — HYDROCODONE-ACETAMINOPHEN 5-325 MG PO TABS: ORAL_TABLET | ORAL | 0 refills | Status: DC

## 2022-08-15 MED ORDER — POVIDONE-IODINE 10 % EX SOLN
CUTANEOUS | Status: DC | PRN
  Filled 2022-08-15: qty 14.8

## 2022-08-15 NOTE — ED Provider Notes (Signed)
[facility] EMERGENCY DEPARTMENT Provider Note   CSN: [id number] Arrival date & time: 08/15/22  1052     History  Chief Complaint  Patient presents with   Finger Injury    Left thumb    Scott [name] is a 36 y.o. male.  HPI     Scott [name] is a 36 y.o. male who presents to the Emergency Department complaining of pain and swelling of the distal left thumb after he suffered a crush injury.  Several hours prior to arrival, patient states that he was changing a tire with a scissor jack when the jack fell striking the distal thumb.  He notes swelling and throbbing sensation of his thumb.  He has had some gradual discoloration of the nail.  He denies any lacerations or bleeding of the finger.  He also denies any numbness of his fingers or pain of his wrist.  Home Medications Prior to Admission medications   Medication Sig Start Date End Date Taking? Authorizing Provider  cefdinir (OMNICEF) 300 MG capsule Take 1 capsule (300 mg total) by mouth 2 (two) times daily. Patient not taking: No sig reported 05/04/18   [name], PA-C  cephALEXin (KEFLEX) 500 MG capsule Take 1 capsule (500 mg total) by mouth 4 (four) times daily. Patient not taking: No sig reported 06/11/18   Horton, Mayer Masker, MD  doxycycline (VIBRAMYCIN) 100 MG capsule Take 1 capsule (100 mg total) by mouth 2 (two) times daily. Patient not taking: No sig reported 02/27/20   Devoria Albe, MD  ibuprofen (ADVIL,MOTRIN) 600 MG tablet Take 1 tablet (600 mg total) by mouth every 8 (eight) hours as needed for moderate pain. Patient not taking: No sig reported 08/18/17   Zadie Rhine, MD  terbinafine (LAMISIL AT) 1 % cream Apply 1 application topically 2 (two) times daily. Patient not taking: No sig reported 06/11/18   Horton, Mayer Masker, MD      Allergies    Patient has no known allergies.    Review of Systems   Review of Systems  Constitutional:  Negative for fever.  Musculoskeletal:  Positive for arthralgias (Left  thumb pain) and joint swelling.  Skin:  Positive for color change.  Neurological:  Negative for weakness and numbness.    Physical Exam Updated Vital Signs BP (!) 132/96 (BP Location: Right Arm)   Pulse 64   Temp 97.6 F (36.4 C) (Oral)   Resp 18   Ht 5\' 10"  (1.778 m)   Wt 69.4 kg   SpO2 100%   BMI 21.95 kg/m  Physical Exam Vitals and nursing note reviewed.  Constitutional:      General: He is not in acute distress.    Appearance: Normal appearance.  Cardiovascular:     Rate and Rhythm: Normal rate and regular rhythm.     Pulses: Normal pulses.  Pulmonary:     Effort: Pulmonary effort is normal.  Musculoskeletal:        General: Swelling, tenderness and signs of injury present.     Left hand: Swelling and tenderness present. No lacerations. Normal range of motion. Normal sensation. There is no disruption of two-point discrimination. Normal capillary refill. Normal pulse.     Comments: Patient with tenderness to palpation of the distal left thumb, bruising noted at the base of the nail.  There is a subungual hematoma present.  Nail is intact.  No open wounds or areas of bleeding.  Proximal thumb nontender  Skin:    General: Skin is warm.  Capillary Refill: Capillary refill takes less than 2 seconds.  Neurological:     General: No focal deficit present.     Mental Status: He is alert.     Sensory: No sensory deficit.     Motor: No weakness.     ED Results / Procedures / Treatments   Labs (all labs ordered are listed, but only abnormal results are displayed) Labs Reviewed - No data to display  EKG None  Radiology DG Finger Thumb Left  Result Date: 08/15/2022 CLINICAL DATA:  Thumb crush injury. EXAM: LEFT THUMB 2+V COMPARISON:  None Available. FINDINGS: Acute nondisplaced oblique fracture of the first distal phalanx tuft. No additional fracture. No dislocation. Joint spaces are preserved. Soft tissue swelling of the thumb. IMPRESSION: 1. Acute nondisplaced first  distal phalanx fracture. Electronically Signed   By: Obie Dredge M.D.   On: 08/15/2022 12:02    Procedures .Nail Removal  Date/Time: 08/15/2022 1:21 PM  Performed by: Pauline Aus, PA-C Authorized by: Pauline Aus, PA-C   Consent:    Consent obtained:  Verbal   Consent given by:  Patient   Risks, benefits, and alternatives were discussed: yes     Risks discussed:  Bleeding Universal protocol:    Patient identity confirmed:  Verbally with patient Location:    Hand:  L thumb Pre-procedure details:    Skin preparation:  Povidone-iodine   Preparation: Patient was prepped and draped in the usual sterile fashion   Anesthesia:    Anesthesia method:  Nerve block   Block needle gauge:  25 G   Block anesthetic:  Lidocaine 2% w/o epi   Block technique:  Digital block   Block injection procedure:  Anatomic landmarks palpated and negative aspiration for blood   Block outcome:  Anesthesia achieved Nail Removal:    Nail removal amount: No nail removal. Trephination:    Subungual hematoma drained: yes     Trephination instrument:  Cautery Post-procedure details:    Dressing:  Splint   Procedure completion:  Tolerated well, no immediate complications     Medications Ordered in ED Medications  povidone-iodine (BETADINE) 10 % external solution (has no administration in time range)  oxyCODONE-acetaminophen (PERCOCET/ROXICET) 5-325 MG per tablet 1 tablet (1 tablet Oral Given 08/15/22 1150)  lidocaine HCl (PF) (XYLOCAINE) 2 % injection 5 mL (5 mLs Intradermal Given 08/15/22 1255)    ED Course/ Medical Decision Making/ A&P                           Medical Decision Making Patient here for evaluation of crush injury to the left distal thumb.  No reported open wounds or active bleeding.  On exam, patient well-appearing nontoxic.  He has tenderness and swelling of the distal left thumb, subungual hematoma noted.  No open wounds or active bleeding.  Nail is intact.  Clinically,  given degree of tenderness and swelling I suspect there is an occult fracture of the distal phalanx.  Will obtain imaging for further evaluation.  Extremity is neurovascularly intact.  Discussed treatment plans and patient agreeable to trephination of the nail.  Amount and/or Complexity of Data Reviewed Radiology: ordered.    Details: X-ray of the thumb shows nondisplaced fracture of the first distal phalanx Discussion of management or test interpretation with external provider(s): Successful trephination of the nail.  Nail is intact.  Digit neurovascularly intact.  Thumb splinted, he is agreeable to close outpatient follow-up with orthopedics.  Wound care instructions discussed  Risk OTC drugs. Prescription drug management.           Final Clinical Impression(s) / ED Diagnoses Final diagnoses:  Closed nondisplaced fracture of distal phalanx of left thumb, initial encounter  Subungual hematoma of digit of hand, initial encounter    Rx / DC Orders ED Discharge Orders     None         Pauline Aus, PA-C 08/17/22 1459    Bethann Berkshire, MD 08/17/22 1713

## 2022-08-15 NOTE — ED Triage Notes (Signed)
Pt to ED c/o left thumb pain. Pt reports at aprox 4 am he was changing his tire and the jack fell on his thumb. Full ROM to thumb, swelling noted.

## 2022-08-15 NOTE — Discharge Instructions (Signed)
Elevate your finger when possible.  Keep it splinted.  Call the orthopedic provider listed to arrange a follow up appt.  You can take ibuprofen 800 mg 3 times a day for pain as well.

## 2022-08-20 ENCOUNTER — Encounter: Payer: Self-pay | Admitting: Orthopedic Surgery

## 2022-08-20 ENCOUNTER — Ambulatory Visit (INDEPENDENT_AMBULATORY_CARE_PROVIDER_SITE_OTHER): Payer: BC Managed Care – PPO | Admitting: Orthopedic Surgery

## 2022-08-20 VITALS — BP 126/79 | HR 75 | Ht 69.5 in | Wt 147.6 lb

## 2022-08-20 DIAGNOSIS — S62525A Nondisplaced fracture of distal phalanx of left thumb, initial encounter for closed fracture: Secondary | ICD-10-CM

## 2022-08-20 NOTE — Patient Instructions (Addendum)
Splint on the finger, ok to remove for hygiene  Bring any paperwork needed to the office and we will complete it for you   Note for work - out until next appointment

## 2022-08-20 NOTE — Progress Notes (Signed)
New Patient Visit  Assessment: Scott [name] is a 36 y.o. male with the following: 1. Closed nondisplaced fracture of distal phalanx of left thumb, initial encounter  Plan: Verdis Frederickson sustained a minimally displaced fracture of the distal phalanx of the left thumb.  Minimal displacement.  He also had a subungual hematoma, which was trephinated in the emergency department.  He reports good relief of his pain.  Plan to continue with nonoperative management.  He will be out of work until he is able to use his hand fully.  Placed a small volar splint on his thumb.  Okay to remove for hygiene.  Follow-up in 3 weeks.  He will provide any documentation needed for work.  We provided him with a work note today.  Follow-up: Return in about 3 weeks (around 09/10/2022).  Subjective:  Chief Complaint  Patient presents with   New Patient (Initial Visit)   Hand Injury    LT thumb DOI 08/15/22    History of Present Illness: Scott [name] is a 36 y.o. male who presents for evaluation of left hand pain.  He states he dropped a jack on his finger a few days ago.  He had some bleeding under his nail.  He presented to the emergency department.  They were able to evacuate a subungual hematoma.  Radiographs demonstrated a fracture of the distal phalanx of the left thumb.  He has been wearing a splint.  He elevates his hand is much as possible.  No other injuries noted.   Review of Systems: No fevers or chills No numbness or tingling No chest pain No shortness of breath No bowel or bladder dysfunction No GI distress No headaches   Medical History:  Past Medical History:  Diagnosis Date   Strep throat     No past surgical history on file.  No family history on file. Social History   Tobacco Use   Smoking status: Every Day    Packs/day: 0.50    Types: Cigarettes   Smokeless tobacco: Never  Vaping Use   Vaping Use: Never used  Substance Use Topics   Alcohol use: Yes     Comment: socially   Drug use: No    No Known Allergies  Current Meds  Medication Sig   cephALEXin (KEFLEX) 500 MG capsule Take 1 capsule (500 mg total) by mouth 3 (three) times daily.   HYDROcodone-acetaminophen (NORCO/VICODIN) 5-325 MG tablet Take one tab po q 4 hrs prn pain   terbinafine (LAMISIL AT) 1 % cream Apply 1 application topically 2 (two) times daily.    Objective: BP 126/79   Pulse 75   Ht 5' 9.5" (1.765 m)   Wt 147 lb 9.6 oz (67 kg)   BMI 21.48 kg/m   Physical Exam:  General: Alert and oriented. and No acute distress. Gait: Normal gait.  Thumb with some bruising and swelling.  There is a drill hole in the nail.  No remaining blood underneath the nail.  Limited motion of the IP joint of the left thumb.  He has tenderness to palpation.  Ecchymosis over the volar aspect of the thumb.  Brisk capillary refill.   IMAGING: I personally reviewed images previously obtained from the ED  X-ray left hand demonstrates minimally displaced fracture of the distal phalanx.  No additional injuries noted.   New Medications:  No orders of the defined types were placed in this encounter.     Oliver Barre, MD  08/20/2022 2:33 PM

## 2022-08-29 ENCOUNTER — Telehealth: Payer: Self-pay | Admitting: Orthopedic Surgery

## 2022-08-29 NOTE — Telephone Encounter (Signed)
Called patient to relay we have received [employer] forms via fax. Discussed forms process. Understands.

## 2022-08-30 DIAGNOSIS — Z0289 Encounter for other administrative examinations: Secondary | ICD-10-CM

## 2022-09-11 ENCOUNTER — Encounter: Payer: Self-pay | Admitting: Orthopedic Surgery

## 2022-09-11 ENCOUNTER — Ambulatory Visit (INDEPENDENT_AMBULATORY_CARE_PROVIDER_SITE_OTHER): Payer: BC Managed Care – PPO | Admitting: Orthopedic Surgery

## 2022-09-11 ENCOUNTER — Ambulatory Visit (INDEPENDENT_AMBULATORY_CARE_PROVIDER_SITE_OTHER): Payer: BC Managed Care – PPO

## 2022-09-11 DIAGNOSIS — S62525D Nondisplaced fracture of distal phalanx of left thumb, subsequent encounter for fracture with routine healing: Secondary | ICD-10-CM

## 2022-09-11 DIAGNOSIS — S62525A Nondisplaced fracture of distal phalanx of left thumb, initial encounter for closed fracture: Secondary | ICD-10-CM

## 2022-09-11 NOTE — Patient Instructions (Signed)
Follow up in 2 weeks.  Out of work until that visit.

## 2022-09-11 NOTE — Progress Notes (Signed)
Return patient Visit  Assessment: Scott [name] is a 36 y.o. male with the following: 1. Closed nondisplaced fracture of distal phalanx of left thumb, subsequent encounter  Plan: Scott [name] sustained a minimally displaced fracture of the distal phalanx of the left thumb.  Pain is improving.  Swelling is improving.  He has good range of motion.  Radiographs are stable.  Okay to discontinue use of the splint.  Record minimal lifting and power gripping, but he can start to use the left hand more.  We will see him in 2 weeks, at that time, he may be able to return to work.  Follow-up: Return in about 2 weeks (around 09/25/2022).  Subjective:  Chief Complaint  Patient presents with   Hand Injury    Left     History of Present Illness: Scott [name] is a 36 y.o. male who returns for evaluation of left hand pain.  He sustained an injury to his left thumb approximately 1 month ago.  He has been immobilized since.  He has started to do some light activities around the house.  His pain has improved, but still notices some discomfort.  Numbness or tingling.  Review of Systems: No fevers or chills No numbness or tingling No chest pain No shortness of breath No bowel or bladder dysfunction No GI distress No headaches      Objective: There were no vitals taken for this visit.  Physical Exam:  General: Alert and oriented. and No acute distress. Gait: Normal gait.  Left thumb with mild mild swelling.  No bruising.  Good range of motion at the IP joint.  He is able to make a full fist, but unable to power grip.  Tenderness to palpation to the distal aspect of the thumb.  Nail is intact.  No residual bruising under the nail.  Fingers are warm and well-perfused.  IMAGING: I personally reviewed images previously obtained from the ED   X-rays of the left thumb were obtained in clinic today.  There is an oblique fracture through the distal phalanx.  This remains in good  alignment.  No interval displacement.  No new injuries are noted.  Impression: Healing left thumb distal phalanx fracture   New Medications:  No orders of the defined types were placed in this encounter.     Mordecai Rasmussen, MD  09/11/2022 9:18 AM

## 2022-09-28 ENCOUNTER — Ambulatory Visit (INDEPENDENT_AMBULATORY_CARE_PROVIDER_SITE_OTHER): Payer: BC Managed Care – PPO

## 2022-09-28 ENCOUNTER — Ambulatory Visit (INDEPENDENT_AMBULATORY_CARE_PROVIDER_SITE_OTHER): Payer: BC Managed Care – PPO | Admitting: Orthopedic Surgery

## 2022-09-28 ENCOUNTER — Encounter: Payer: Self-pay | Admitting: Orthopedic Surgery

## 2022-09-28 VITALS — Ht 69.0 in | Wt 150.1 lb

## 2022-09-28 DIAGNOSIS — S62525D Nondisplaced fracture of distal phalanx of left thumb, subsequent encounter for fracture with routine healing: Secondary | ICD-10-CM

## 2022-09-28 NOTE — Patient Instructions (Signed)
Call if better and ready to go back to work sooner

## 2022-09-28 NOTE — Progress Notes (Signed)
Return patient Visit  Assessment: Scott [name] is a 36 y.o. male with the following: 1. Closed nondisplaced fracture of distal phalanx of left thumb, subsequent encounter  Plan: Francee Piccolo sustained a minimally displaced fracture of the distal phalanx of the left thumb.  Radiographs are stable.  He has minimal pain.  However, he does not feel as though he can return to work.  I think it is reasonable for him to take a little bit more time to focus on strengthening of the left hand.  He is in agreement with this plan.  We will see him in 2 weeks.  At that time, anticipate he will be able to go back to work.  Follow-up: Return in about 2 weeks (around 10/12/2022).  Subjective:  Chief Complaint  Patient presents with   Hand Pain    L/ thumb it is slowly getting better.    History of Present Illness: Scott [name] is a 36 y.o. male who returns for evaluation of left hand pain.  He sustained an injury to his left thumb approximately 6 weeks ago.  He stopped immobilization approximately 2 weeks ago.  His pain continues to improve.  However, it is not 100%.  He still has some pain in the thumb, depending on the activities.  He does not feel as though he could return to work and lift 40-50 pound boxes.    Review of Systems: No fevers or chills No numbness or tingling No chest pain No shortness of breath No bowel or bladder dysfunction No GI distress No headaches      Objective: Ht 5\' 9"  (1.753 m)   Wt 150 lb 2 oz (68.1 kg)   BMI 22.17 kg/m   Physical Exam:  General: Alert and oriented. and No acute distress. Gait: Normal gait.  Minimal swelling left thumb.  Subungual hematoma is healing.  Slightly restricted range of motion of the IP joint.  No tenderness to palpation of the distal phalanx.  Sensation is intact distally.  IMAGING: I personally reviewed images previously obtained from the ED   X-rays of the left thumb were obtained in clinic today.  These  were compared to prior x-rays.  There is been no interval displacement.  The fracture lines are still visible, but remain unchanged.  No intra-articular involvement.  No acute injuries are noted.  Impression: Healed left distal phalanx fracture, minimally displaced   New Medications:  No orders of the defined types were placed in this encounter.     Mordecai Rasmussen, MD  09/28/2022 9:17 AM

## 2022-10-15 ENCOUNTER — Encounter: Payer: Self-pay | Admitting: Orthopedic Surgery

## 2022-10-15 ENCOUNTER — Ambulatory Visit (INDEPENDENT_AMBULATORY_CARE_PROVIDER_SITE_OTHER): Payer: BC Managed Care – PPO | Admitting: Orthopedic Surgery

## 2022-10-15 DIAGNOSIS — S62525D Nondisplaced fracture of distal phalanx of left thumb, subsequent encounter for fracture with routine healing: Secondary | ICD-10-CM

## 2022-10-15 NOTE — Progress Notes (Signed)
Return patient Visit  Assessment: RHODERICK FARREL is a 36 y.o. male with the following: 1. Closed nondisplaced fracture of distal phalanx of left thumb, subsequent encounter  Plan: Francee Piccolo sustained a minimally displaced fracture of the distal phalanx of the left thumb.  He does not feel as though his thumb is 100%, but it is healed sufficiently for him to return to work next week.  This will continue to improve, especially as he works on [employer].  Provided him with a letter for work.  He will return to clinic as needed.   Follow-up: Return if symptoms worsen or fail to improve.  Subjective:  Chief Complaint  Patient presents with   Hand Injury    LT thumb/ still hurts notices it more with the cooler weather DOI 08/15/22    History of Present Illness: HAJIME ASFAW is a 36 y.o. male who returns for evaluation of left hand pain.  He sustained an injury to his left thumb approximately 2 months ago.  He has been using his hand for more activities.  He is concerned because his thumb does not feel as though it is 100%.  He has occasional pains, depending on what he is doing.  He feels comfortable returning to work in 1 week.   Review of Systems: No fevers or chills No numbness or tingling No chest pain No shortness of breath No bowel or bladder dysfunction No GI distress No headaches      Objective: There were no vitals taken for this visit.  Physical Exam:  General: Alert and oriented. and No acute distress. Gait: Normal gait.  No swelling left thumb.  Subungual hematoma is healing.  Full range of motion of the IP joint.  No tenderness to palpation of the distal phalanx.  Sensation is intact distally.  IMAGING: I personally reviewed images previously obtained from the ED   Imaging obtained today  New Medications:  No orders of the defined types were placed in this encounter.     Mordecai Rasmussen, MD  10/15/2022 8:39 AM

## 2022-10-15 NOTE — Patient Instructions (Signed)
Return to work in one week - provide letter

## 2023-02-21 ENCOUNTER — Encounter: Payer: Self-pay | Admitting: Radiology

## 2023-11-06 ENCOUNTER — Encounter: Payer: Self-pay | Admitting: *Deleted

## 2023-11-06 ENCOUNTER — Ambulatory Visit
Admission: EM | Admit: 2023-11-06 | Discharge: 2023-11-06 | Disposition: A | Discharge status: Home / Self Care | Payer: Medicaid Other | Attending: Nurse Practitioner | Admitting: Nurse Practitioner

## 2023-11-06 DIAGNOSIS — R369 Urethral discharge, unspecified: Secondary | ICD-10-CM | POA: Insufficient documentation

## 2023-11-06 DIAGNOSIS — Z113 Encounter for screening for infections with a predominantly sexual mode of transmission: Secondary | ICD-10-CM | POA: Diagnosis not present

## 2023-11-06 LAB — POCT URINALYSIS DIP (MANUAL ENTRY)
Bilirubin, UA: NEGATIVE
Blood, UA: NEGATIVE
Glucose, UA: NEGATIVE mg/dL
Leukocytes, UA: NEGATIVE
Nitrite, UA: NEGATIVE
Protein Ur, POC: NEGATIVE mg/dL
Spec Grav, UA: 1.025 (ref 1.010–1.025)
Urobilinogen, UA: 0.2 U/dL
pH, UA: 6 (ref 5.0–8.0)

## 2023-11-06 NOTE — Discharge Instructions (Signed)
The urinalysis does not indicate that you have a urinary tract infection.  A urine culture is not indicated.  Cytology testing is pending.  You will be contacted if the pending test results are positive.  You also have access to the results via MyChart. Refrain from sexual intercourse until all of your pending test results have been received. If your test results are positive, you will need to notify all sexual partners. Increase condom use with each sexual encounter. Follow-up as needed.

## 2023-11-06 NOTE — ED Provider Notes (Signed)
[facility]    CSN: [id number] Arrival date & time: 11/06/23  1606      History   Chief Complaint Chief Complaint  Patient presents with   Penile Discharge   Groin Swelling    HPI Scott [name] is a 37 y.o. male.   The history is provided by the patient.   Patient presents for complaints of penile discharge and right sided groin pain that started over the past 24 hours.  Patient states he is concerned that maybe may have a UTI.  He states that he has had the same or similar symptoms in the past, and fears that his partner may be cheating on him.  Patient denies fever, chills, hematuria, urinary frequency, urgency, hesitancy, abdominal pain, low flank pain, or decreased urine stream.  Patient reports prior history of gonorrhea and chlamydia.  Patient reports he has had 1 male partner over the past 90 days.    Past Medical History:  Diagnosis Date   Strep throat     There are no problems to display for this patient.   History reviewed. No pertinent surgical history.     Home Medications    Prior to Admission medications   Not on File    Family History History reviewed. No pertinent family history.  Social History Social History   Tobacco Use   Smoking status: Every Day    Current packs/day: 0.50    Types: Cigarettes   Smokeless tobacco: Never  Vaping Use   Vaping status: Never Used  Substance Use Topics   Alcohol use: Yes    Comment: socially   Drug use: Yes    Types: Marijuana     Allergies   Patient has no known allergies.   Review of Systems Review of Systems Per HPI  Physical Exam Triage Vital Signs ED Triage Vitals  Encounter Vitals Group     BP 11/06/23 1616 112/73     Systolic BP Percentile --      Diastolic BP Percentile --      Pulse Rate 11/06/23 1616 75     Resp 11/06/23 1616 16     Temp 11/06/23 1616 98.2 F (36.8 C)     Temp Source 11/06/23 1616 Oral     SpO2 11/06/23 1616 98 %     Weight --      Height  --      Head Circumference --      Peak Flow --      Pain Score 11/06/23 1615 0     Pain Loc --      Pain Education --      Exclude from Growth Chart --    No data found.  Updated Vital Signs BP 112/73 (BP Location: Right Arm)   Pulse 75   Temp 98.2 F (36.8 C) (Oral)   Resp 16   SpO2 98%   Visual Acuity Right Eye Distance:   Left Eye Distance:   Bilateral Distance:    Right Eye Near:   Left Eye Near:    Bilateral Near:     Physical Exam Vitals and nursing note reviewed.  Constitutional:      General: He is not in acute distress.    Appearance: Normal appearance.  HENT:     Head: Normocephalic.  Eyes:     Extraocular Movements: Extraocular movements intact.     Pupils: Pupils are equal, round, and reactive to light.  Cardiovascular:     Rate and Rhythm: Normal  rate and regular rhythm.     Pulses: Normal pulses.  Pulmonary:     Effort: Pulmonary effort is normal. No respiratory distress.     Breath sounds: Normal breath sounds. No stridor. No wheezing, rhonchi or rales.  Abdominal:     General: Abdomen is flat. Bowel sounds are normal.     Palpations: Abdomen is soft.     Tenderness: There is no abdominal tenderness.  Genitourinary:    Comments: GU exam deferred, self swab performed  Musculoskeletal:     Cervical back: Normal range of motion.  Lymphadenopathy:     Cervical: No cervical adenopathy.  Skin:    General: Skin is warm and dry.  Neurological:     General: No focal deficit present.     Mental Status: He is alert and oriented to person, place, and time.  Psychiatric:        Mood and Affect: Mood normal.        Behavior: Behavior normal.      UC Treatments / Results  Labs (all labs ordered are listed, but only abnormal results are displayed) Labs Reviewed  POCT URINALYSIS DIP (MANUAL ENTRY) - Abnormal; Notable for the following components:      Result Value   Ketones, POC UA trace (5) (*)    All other components within normal limits   CYTOLOGY, (ORAL, ANAL, URETHRAL) ANCILLARY ONLY    EKG   Radiology No results found.  Procedures Procedures (including critical care time)  Medications Ordered in UC Medications - No data to display  Initial Impression / Assessment and Plan / UC Course  I have reviewed the triage vital signs and the nursing notes.  Pertinent labs & imaging results that were available during my care of the patient were reviewed by me and considered in my medical decision making (see chart for details).  Urinalysis does not indicate a urinary tract infection.  No indication to send urine for a culture as patient only has positive ketones.  Cytology swab is pending.  Patient states he would like to wait until his results are received before treatment.  Patient was given supportive care recommendations to include refraining from sexual intercourse until symptoms are received, notifying all sexual partners if test results are positive, and increasing condom use with each sexual encounter.  Patient was in agreement with this plan of care and verbalizes understanding.  All questions were answered.  Patient stable for discharge.  Final Clinical Impressions(s) / UC Diagnoses   Final diagnoses:  Penile discharge  Screening examination for sexually transmitted disease     Discharge Instructions      The urinalysis does not indicate that you have a urinary tract infection.  A urine culture is not indicated.  Cytology testing is pending.  You will be contacted if the pending test results are positive.  You also have access to the results via MyChart. Refrain from sexual intercourse until all of your pending test results have been received. If your test results are positive, you will need to notify all sexual partners. Increase condom use with each sexual encounter. Follow-up as needed.     ED Prescriptions   None    PDMP not reviewed this encounter.   Abran Cantor, NP 11/06/23  1651

## 2023-11-06 NOTE — ED Triage Notes (Signed)
States he has been having some right sided groin swelling x 1 day. Today after his shower he noticed some discharge from his penis.

## 2023-11-07 ENCOUNTER — Telehealth: Payer: Self-pay

## 2023-11-07 ENCOUNTER — Ambulatory Visit
Admission: EM | Admit: 2023-11-07 | Discharge: 2023-11-07 | Disposition: A | Discharge status: Home / Self Care | Payer: Medicaid Other | Attending: Nurse Practitioner | Admitting: Nurse Practitioner

## 2023-11-07 DIAGNOSIS — Z8619 Personal history of other infectious and parasitic diseases: Secondary | ICD-10-CM

## 2023-11-07 DIAGNOSIS — R369 Urethral discharge, unspecified: Secondary | ICD-10-CM | POA: Diagnosis not present

## 2023-11-07 LAB — CYTOLOGY, (ORAL, ANAL, URETHRAL) ANCILLARY ONLY
Chlamydia: NEGATIVE
Comment: NEGATIVE
Comment: NEGATIVE
Comment: NORMAL
Neisseria Gonorrhea: POSITIVE — AB
Trichomonas: NEGATIVE

## 2023-11-07 MED ORDER — AZITHROMYCIN 500 MG PO TABS: 1000.0000 mg | ORAL_TABLET | Freq: Once | ORAL | 0 refills | Status: AC

## 2023-11-07 MED ORDER — CEFTRIAXONE SODIUM 250 MG IJ SOLR
500.0000 mg | Freq: Once | INTRAMUSCULAR | Status: AC
  Administered 2023-11-07: 500 mg via INTRAMUSCULAR

## 2023-11-07 NOTE — Telephone Encounter (Signed)
Per protocol, pt will need to return to UC for Nurse Visit for Rocephin 500mg  IM.

## 2023-11-07 NOTE — ED Triage Notes (Signed)
Pt presents to UC for STD treatment.

## 2023-12-10 ENCOUNTER — Emergency Department (HOSPITAL_COMMUNITY)
Admission: EM | Admit: 2023-12-10 | Discharge: 2023-12-10 | Disposition: A | Discharge status: Home / Self Care | Payer: Medicaid Other | Attending: Student | Admitting: Student

## 2023-12-10 ENCOUNTER — Other Ambulatory Visit: Payer: Self-pay

## 2023-12-10 ENCOUNTER — Encounter (HOSPITAL_COMMUNITY): Payer: Self-pay

## 2023-12-10 DIAGNOSIS — M542 Cervicalgia: Secondary | ICD-10-CM | POA: Diagnosis present

## 2023-12-10 DIAGNOSIS — M436 Torticollis: Secondary | ICD-10-CM | POA: Insufficient documentation

## 2023-12-10 MED ORDER — IBUPROFEN 800 MG PO TABS: 800.0000 mg | ORAL_TABLET | Freq: Three times a day (TID) | ORAL | 0 refills | Status: AC

## 2023-12-10 MED ORDER — DIAZEPAM 5 MG PO TABS: 5.0000 mg | ORAL_TABLET | Freq: Three times a day (TID) | ORAL | 0 refills | Status: AC | PRN

## 2023-12-10 MED ORDER — IBUPROFEN 800 MG PO TABS
800.0000 mg | ORAL_TABLET | Freq: Once | ORAL | Status: AC
  Administered 2023-12-10: 800 mg via ORAL
  Filled 2023-12-10: qty 1

## 2023-12-10 MED ORDER — DIAZEPAM 5 MG PO TABS
5.0000 mg | ORAL_TABLET | Freq: Once | ORAL | Status: AC
  Administered 2023-12-10: 5 mg via ORAL
  Filled 2023-12-10: qty 1

## 2023-12-10 NOTE — Discharge Instructions (Signed)
Alternate ice and heat to your neck.  Avoid twisting movements heavy lifting reaching or pulling movements for at least 1 week.  Take the medication as directed.  Follow-up with your primary care provider for recheck return to the emergency department for any new or worsening symptoms

## 2023-12-10 NOTE — ED Triage Notes (Signed)
Pt arrives ambulatory to ED with pain to right neck with spasms into shoulder. Pt has not taken anything by mouth for pain but did try topical at home with no relief. Denies any injury.

## 2023-12-10 NOTE — ED Provider Notes (Cosign Needed Addendum)
[place] EMERGENC[facility] DEPARTMENT AT [facility] Provider Note   CSN: [id number] Arrival date & time: 12/10/23  1104     History  Chief Complaint  Patient presents with   Neck Pain    Scott [name] is a 37 y.o. male.   Neck Pain Associated symptoms: no chest pain, no fever, no headaches, no numbness and no weakness        Scott [name] is a 37 y.o. male who presents to the Emergency Department complaining of sudden onset of right neck pain upon waking this morning.  Describes pain to the lateral right neck worse with movement.  Endorses having intermittent sharp stabbing type pains to the same area.  Pain worsens with neck movement to the right and radiates to right shoulder but does not radiate into his back chest or down his right arm.  Denies any weakness or numbness of his arm or face.  No numbness or tingling, headache or dizziness.  Use topical muscle cream this morning without relief.  He is unsure if he "slept the wrong way."    Home Medications Prior to Admission medications   Not on File      Allergies    Patient has no known allergies.    Review of Systems   Review of Systems  Constitutional:  Negative for chills and fever.  Eyes:  Negative for visual disturbance.  Respiratory:  Negative for cough and shortness of breath.   Cardiovascular:  Negative for chest pain.  Musculoskeletal:  Positive for neck pain.  Skin:  Negative for color change and rash.  Neurological:  Negative for dizziness, syncope, facial asymmetry, weakness, light-headedness, numbness and headaches.    Physical Exam Updated Vital Signs BP (!) 150/100 (BP Location: Right Arm)   Pulse 65   Temp 97.8 F (36.6 C) (Oral)   Resp 18   Ht 5\' 10"  (1.778 m)   Wt 68 kg   SpO2 99%   BMI 21.52 kg/m  Physical Exam Vitals and nursing note reviewed.  Constitutional:      General: He is not in acute distress.    Appearance: Normal appearance. He is not ill-appearing or  toxic-appearing.  Neck:     Trachea: Phonation normal.     Comments: Tender to palpation right SCM.  No midline tenderness or bony step-off. Cardiovascular:     Rate and Rhythm: Normal rate and regular rhythm.     Pulses: Normal pulses.  Pulmonary:     Effort: Pulmonary effort is normal.  Chest:     Chest wall: No tenderness.  Musculoskeletal:        General: Normal range of motion.     Cervical back: Torticollis and tenderness present. No edema, rigidity or crepitus. Muscular tenderness present. No spinous process tenderness.  Skin:    General: Skin is warm.     Capillary Refill: Capillary refill takes less than 2 seconds.  Neurological:     General: No focal deficit present.     Mental Status: He is alert.     Sensory: No sensory deficit.     Motor: No weakness.     ED Results / Procedures / Treatments   Labs (all labs ordered are listed, but only abnormal results are displayed) Labs Reviewed - No data to display  EKG None  Radiology No results found.  Procedures Procedures    Medications Ordered in ED Medications  diazepam (VALIUM) tablet 5 mg (has no administration in time range)  ibuprofen (  ADVIL) tablet 800 mg (has no administration in time range)    ED Course/ Medical Decision Making/ A&P                                 Medical Decision Making Patient here for evaluation focal pain right lateral neck that began upon waking this morning.  Endorses dull aching pain that worsens with movement of his neck and he is having intermittent sharp stabbing pains to the same area.  No pain of his upper back, chest, or pains radiating down his right arm.  He denies any numbness or weakness of his extremities.  No dizziness headache or visual change.   Clinically, I suspect this is torticollis.  No reported injury to suggest spinous process, or need for imaging at this time.  No radicular sx's on exam.  No recent illness or headache to suggest meningitis.  Doubt  vertebral artery dissection  Amount and/or Complexity of Data Reviewed Discussion of management or test interpretation with external provider(s): I suspect torticollis.  Patient has reassuring neurovascular exam.  Localized tenderness over the right SCM.  Will treat symptomatically, patient appropriate for discharge home prescription written for muscle relaxer and anti-inflammatory.  He will apply ice and heat.  Return precautions were discussed  Risk Prescription drug management.           Final Clinical Impression(s) / ED Diagnoses Final diagnoses:  Torticollis, acute    Rx / DC Orders ED Discharge Orders     None         Pauline Aus, PA-C 12/10/23 1726    Pauline Aus, PA-C 12/10/23 1727    Kommor, Wyn Forster, MD 12/11/23 1512
